# Patient Record
Sex: Female | Born: 1945 | Race: Black or African American | Hispanic: No | Marital: Married | Smoking: Former smoker
Health system: Southern US, Community
[De-identification: ages and names within clinical notes are randomized; demographics above are authoritative.]

## PROBLEM LIST (undated history)

## (undated) DIAGNOSIS — E785 Hyperlipidemia, unspecified: Secondary | ICD-10-CM

## (undated) DIAGNOSIS — E049 Nontoxic goiter, unspecified: Secondary | ICD-10-CM

## (undated) DIAGNOSIS — Z862 Personal history of diseases of the blood and blood-forming organs and certain disorders involving the immune mechanism: Secondary | ICD-10-CM

## (undated) DIAGNOSIS — M1990 Unspecified osteoarthritis, unspecified site: Secondary | ICD-10-CM

## (undated) DIAGNOSIS — J302 Other seasonal allergic rhinitis: Secondary | ICD-10-CM

## (undated) DIAGNOSIS — T7840XA Allergy, unspecified, initial encounter: Secondary | ICD-10-CM

## (undated) DIAGNOSIS — IMO0002 Reserved for concepts with insufficient information to code with codable children: Secondary | ICD-10-CM

## (undated) DIAGNOSIS — Z5189 Encounter for other specified aftercare: Secondary | ICD-10-CM

## (undated) DIAGNOSIS — D649 Anemia, unspecified: Secondary | ICD-10-CM

## (undated) HISTORY — DX: Allergy, unspecified, initial encounter: T78.40XA

## (undated) HISTORY — DX: Personal history of diseases of the blood and blood-forming organs and certain disorders involving the immune mechanism: Z86.2

## (undated) HISTORY — PX: DILATION AND CURETTAGE OF UTERUS: SHX78

## (undated) HISTORY — DX: Hyperlipidemia, unspecified: E78.5

## (undated) HISTORY — DX: Encounter for other specified aftercare: Z51.89

## (undated) HISTORY — DX: Unspecified osteoarthritis, unspecified site: M19.90

## (undated) HISTORY — PX: OVARIAN CYST REMOVAL: SHX89

## (undated) HISTORY — DX: Nontoxic goiter, unspecified: E04.9

## (undated) HISTORY — DX: Anemia, unspecified: D64.9

## (undated) HISTORY — DX: Other seasonal allergic rhinitis: J30.2

## (undated) HISTORY — DX: Reserved for concepts with insufficient information to code with codable children: IMO0002

---

## 1975-03-12 HISTORY — PX: APPENDECTOMY: SHX54

## 1975-03-12 HISTORY — PX: MYOMECTOMY: SHX85

## 1980-03-11 HISTORY — PX: LAPAROSCOPY: SHX197

## 2004-03-11 HISTORY — PX: BIOPSY THYROID: PRO38

## 2005-03-11 DIAGNOSIS — E049 Nontoxic goiter, unspecified: Secondary | ICD-10-CM

## 2005-03-11 HISTORY — DX: Nontoxic goiter, unspecified: E04.9

## 2011-08-27 DIAGNOSIS — H2510 Age-related nuclear cataract, unspecified eye: Secondary | ICD-10-CM | POA: Diagnosis not present

## 2012-09-04 DIAGNOSIS — H43399 Other vitreous opacities, unspecified eye: Secondary | ICD-10-CM | POA: Diagnosis not present

## 2012-09-04 DIAGNOSIS — H2510 Age-related nuclear cataract, unspecified eye: Secondary | ICD-10-CM | POA: Diagnosis not present

## 2012-10-08 ENCOUNTER — Ambulatory Visit (INDEPENDENT_AMBULATORY_CARE_PROVIDER_SITE_OTHER): Payer: Medicare Other | Admitting: Family Medicine

## 2012-10-08 ENCOUNTER — Encounter: Payer: Self-pay | Admitting: Family Medicine

## 2012-10-08 VITALS — BP 150/90 | HR 84 | Temp 97.8°F | Ht 61.5 in | Wt 167.5 lb

## 2012-10-08 DIAGNOSIS — E785 Hyperlipidemia, unspecified: Secondary | ICD-10-CM | POA: Insufficient documentation

## 2012-10-08 DIAGNOSIS — Z862 Personal history of diseases of the blood and blood-forming organs and certain disorders involving the immune mechanism: Secondary | ICD-10-CM | POA: Insufficient documentation

## 2012-10-08 DIAGNOSIS — E049 Nontoxic goiter, unspecified: Secondary | ICD-10-CM

## 2012-10-08 DIAGNOSIS — J309 Allergic rhinitis, unspecified: Secondary | ICD-10-CM

## 2012-10-08 DIAGNOSIS — J302 Other seasonal allergic rhinitis: Secondary | ICD-10-CM

## 2012-10-08 DIAGNOSIS — R030 Elevated blood-pressure reading, without diagnosis of hypertension: Secondary | ICD-10-CM

## 2012-10-08 MED ORDER — ATORVASTATIN CALCIUM 20 MG PO TABS: 20.0000 mg | ORAL_TABLET | Freq: Every day | ORAL | Status: DC

## 2012-10-08 NOTE — Progress Notes (Signed)
Subjective:    Patient ID: Valerie Paul, female    DOB: 02-18-1946, 67 y.o.   MRN: [id number]  HPI CC: new pt to establish  Medicare eligible 03/12/2011. Pleasant 67 yo presents today to establish care.  Prior saw Dr. Josph Macho and Dr. Charlann Boxer in [place], [place].  Moved here 3 years ago.  Elevated BP - today elevated.  Usually runs a bit low - attributes to new doctor.  HLD - ran out of lipitor 2 yrs ago.  Prior tolerated well.  Unsure how high chol was prior to starting statin.  H/o OA - stable currently.  Brings hard copy of xrays done in past.  Not fasting today.  Lives with husband, no pets Occupation: retired, was Wellsite geologist Edu: BS business administration Activity: exercise class 3x/wk, walking  Diet: good water, some fruits/vegetables  Preventative: Last CPE 4 yrs ago. Pap 1997 mammo 1997 G0P0 No tetanus shot recently, no pneumovax. Flu shot - did receive in the past  Medications and allergies reviewed and updated in chart.  Past histories reviewed and updated if relevant as below. There are no active problems to display for this patient.  Past Medical History  Diagnosis Date  . History of anemia   . Arthritis   . Seasonal allergies     pollen  . HLD (hyperlipidemia)   . Goiter 2007    s/p benign biopsy, ([facility])  . Infertility 1970s    tube blockage   Past Surgical History  Procedure Laterality Date  . Dilation and curettage of uterus    . Laparoscopy  1982  . Myomectomy  1977    fibroids  . Ovarian cyst removal      right  . Appendectomy  1977  . Biopsy thyroid  2006    normal per pt Josph Macho)   History  Substance Use Topics  . Smoking status: Former Smoker -- 1.00 packs/day    Start date: 03/12/1963    Quit date: 03/12/2003  . Smokeless tobacco: Never Used  . Alcohol Use: Yes     Comment: Rare   Family History  Problem Relation Age of Onset  . Cancer Brother 48    lung (smoker)  . Cancer Mother 30    brain  . Heart failure Father   .  CAD Neg Hx   . Stroke Neg Hx   . Diabetes Neg Hx   . Hypertension Maternal Aunt    No Known Allergies No current outpatient prescriptions on file prior to visit.   No current facility-administered medications on file prior to visit.     Review of Systems  Constitutional: Negative for fever, chills, activity change, appetite change, fatigue and unexpected weight change.  HENT: Negative for hearing loss and neck pain.   Eyes: Negative for visual disturbance.  Respiratory: Negative for cough, chest tightness, shortness of breath and wheezing.   Cardiovascular: Negative for chest pain, palpitations and leg swelling.  Gastrointestinal: Negative for nausea, vomiting, abdominal pain, diarrhea, constipation, blood in stool and abdominal distention.  Genitourinary: Negative for hematuria and difficulty urinating.  Musculoskeletal: Negative for myalgias and arthralgias.  Skin: Negative for rash.  Neurological: Negative for dizziness, seizures, syncope and headaches.  Hematological: Negative for adenopathy. Does not bruise/bleed easily.  Psychiatric/Behavioral: Negative for dysphoric mood. The patient is not nervous/anxious.        Objective:   Physical Exam  Nursing note and vitals reviewed. Constitutional: She is oriented to person, place, and time. She appears well-developed and well-nourished. No distress.  HENT:  Head: Normocephalic and atraumatic.  Mouth/Throat: Oropharynx is clear and moist. No oropharyngeal exudate.  Eyes: Conjunctivae and EOM are normal. Pupils are equal, round, and reactive to light. No scleral icterus.  Neck: Normal range of motion. Neck supple. No thyromegaly present.  Cardiovascular: Normal rate, regular rhythm, normal heart sounds and intact distal pulses.   No murmur heard. Pulses:      Radial pulses are 2+ on the right side, and 2+ on the left side.  Pulmonary/Chest: Effort normal and breath sounds normal. No respiratory distress. She has no wheezes. She  has no rales.  Abdominal: Soft. Bowel sounds are normal. She exhibits no distension and no mass. There is no tenderness. There is no rebound and no guarding.  Musculoskeletal: Normal range of motion. She exhibits no edema.  Lymphadenopathy:    She has no cervical adenopathy.  Neurological: She is alert and oriented to person, place, and time.  CN grossly intact, station and gait intact  Skin: Skin is warm and dry. No rash noted.  Psychiatric: She has a normal mood and affect. Her behavior is normal. Judgment and thought content normal.       Assessment & Plan:  Provided with # for breast center for pt to call and schedule screening mammogram.

## 2012-10-08 NOTE — Assessment & Plan Note (Signed)
Check FLP then start prior lipitor.

## 2012-10-08 NOTE — Assessment & Plan Note (Signed)
Check TSH next blood draw. Will request records at next appointment.

## 2012-10-08 NOTE — Assessment & Plan Note (Signed)
Discussed this - discussed DASH and provided with handout, recommended mediterranean diet. Pt will monitor at local pharmacy and notify me if consistently elevated.

## 2012-10-08 NOTE — Patient Instructions (Signed)
Return tomorrow fasting for blood work. Let's keep an eye on blood pressure - at local pharmacy.  Let me know if consistently >140/90. Look into mediterranean diet. Return in next few months at your convenience for medicare wellness visit. Good to see you today, call us with questions.   DASH Diet The DASH diet stands for "Dietary Approaches to Stop Hypertension." It is a healthy eating plan that has been shown to reduce high blood pressure (hypertension) in as [place] as 14 days, while also possibly providing other significant health benefits. These other health benefits include reducing the risk of breast cancer after menopause and reducing the risk of type 2 diabetes, heart disease, colon cancer, and stroke. Health benefits also include weight loss and slowing kidney failure in patients with chronic kidney disease.  DIET GUIDELINES  Limit salt (sodium). Your diet should contain less than 1500 mg of sodium daily.  Limit refined or processed carbohydrates. Your diet should include mostly whole grains. Desserts and added sugars should be used sparingly.  Include small amounts of heart-healthy fats. These types of fats include nuts, oils, and tub margarine. Limit saturated and trans fats. These fats have been shown to be harmful in the body. CHOOSING FOODS  The following food groups are based on a 2000 calorie diet. See your Registered Dietitian for individual calorie needs. Grains and Grain Products (6 to 8 servings daily)  Eat More Often: Whole-wheat bread, brown rice, whole-grain or wheat pasta, quinoa, popcorn without added fat or salt (air popped).  Eat Less Often: White bread, white pasta, white rice, cornbread. Vegetables (4 to 5 servings daily)  Eat More Often: Fresh, frozen, and canned vegetables. Vegetables may be raw, steamed, roasted, or grilled with a minimal amount of fat.  Eat Less Often/Avoid: Creamed or fried vegetables. Vegetables in a cheese sauce. Fruit (4 to 5 servings  daily)  Eat More Often: All fresh, canned (in natural juice), or frozen fruits. Dried fruits without added sugar. One hundred percent fruit juice ( cup [237 mL] daily).  Eat Less Often: Dried fruits with added sugar. Canned fruit in light or heavy syrup. [employer], Fish, and Poultry (2 servings or less daily. One serving is 3 to 4 oz [85-114 g]).  Eat More Often: Ninety percent or leaner ground beef, tenderloin, sirloin. Round cuts of beef, chicken breast, [place] breast. All fish. Grill, bake, or broil your meat. Nothing should be fried.  Eat Less Often/Avoid: Fatty cuts of meat, [place], or chicken leg, thigh, or wing. Fried cuts of meat or fish. Dairy (2 to 3 servings)  Eat More Often: Low-fat or fat-free milk, low-fat plain or light yogurt, reduced-fat or part-skim cheese.  Eat Less Often/Avoid: Milk (whole, 2%).Whole milk yogurt. Full-fat cheeses. Nuts, Seeds, and Legumes (4 to 5 servings per week)  Eat More Often: All without added salt.  Eat Less Often/Avoid: Salted nuts and seeds, canned beans with added salt. Fats and Sweets (limited)  Eat More Often: Vegetable oils, tub margarines without trans fats, sugar-free gelatin. Mayonnaise and salad dressings.  Eat Less Often/Avoid: Coconut oils, palm oils, butter, stick margarine, cream, half and half, cookies, candy, pie. FOR MORE INFORMATION The Dash Diet Eating Plan: www.dashdiet.org Document Released: 02/14/2011 Document Revised: 05/20/2011 Document Reviewed: 02/14/2011 [employer] Patient Information 2014 [place], [place].

## 2012-10-09 ENCOUNTER — Other Ambulatory Visit (INDEPENDENT_AMBULATORY_CARE_PROVIDER_SITE_OTHER): Payer: Medicare Other

## 2012-10-09 DIAGNOSIS — E049 Nontoxic goiter, unspecified: Secondary | ICD-10-CM

## 2012-10-09 DIAGNOSIS — R030 Elevated blood-pressure reading, without diagnosis of hypertension: Secondary | ICD-10-CM | POA: Diagnosis not present

## 2012-10-09 DIAGNOSIS — E785 Hyperlipidemia, unspecified: Secondary | ICD-10-CM | POA: Diagnosis not present

## 2012-10-09 DIAGNOSIS — Z862 Personal history of diseases of the blood and blood-forming organs and certain disorders involving the immune mechanism: Secondary | ICD-10-CM | POA: Diagnosis not present

## 2012-10-09 LAB — CBC WITH DIFFERENTIAL/PLATELET
Basophils Absolute: 0.1 10*3/uL (ref 0.0–0.1)
Basophils Relative: 1.4 % (ref 0.0–3.0)
Eosinophils Absolute: 0.2 10*3/uL (ref 0.0–0.7)
Hemoglobin: 13.3 g/dL (ref 12.0–15.0)
Lymphocytes Relative: 46.8 % — ABNORMAL HIGH (ref 12.0–46.0)
MCHC: 33.7 g/dL (ref 30.0–36.0)
MCV: 88.4 fl (ref 78.0–100.0)
Monocytes Absolute: 0.4 10*3/uL (ref 0.1–1.0)
Neutro Abs: 1.9 10*3/uL (ref 1.4–7.7)
RDW: 13.1 % (ref 11.5–14.6)

## 2012-10-09 LAB — BASIC METABOLIC PANEL
CO2: 28 mEq/L (ref 19–32)
Calcium: 9.3 mg/dL (ref 8.4–10.5)
Chloride: 106 mEq/L (ref 96–112)
Creatinine, Ser: 1 mg/dL (ref 0.4–1.2)
Glucose, Bld: 92 mg/dL (ref 70–99)

## 2012-10-09 LAB — LIPID PANEL
HDL: 37.2 mg/dL — ABNORMAL LOW (ref >39.00)
Triglycerides: 214 mg/dL — ABNORMAL HIGH (ref 0.0–149.0)

## 2012-10-14 ENCOUNTER — Other Ambulatory Visit: Payer: Self-pay

## 2013-01-08 ENCOUNTER — Other Ambulatory Visit (HOSPITAL_COMMUNITY)
Admission: RE | Admit: 2013-01-08 | Discharge: 2013-01-08 | Disposition: A | Discharge status: Home / Self Care | Payer: Medicare Other | Source: Ambulatory Visit | Attending: Family Medicine | Admitting: Family Medicine

## 2013-01-08 ENCOUNTER — Other Ambulatory Visit: Payer: Self-pay | Admitting: *Deleted

## 2013-01-08 ENCOUNTER — Encounter: Payer: Self-pay | Admitting: Family Medicine

## 2013-01-08 ENCOUNTER — Ambulatory Visit (INDEPENDENT_AMBULATORY_CARE_PROVIDER_SITE_OTHER): Payer: Medicare Other | Admitting: Family Medicine

## 2013-01-08 VITALS — BP 146/88 | HR 84 | Temp 98.5°F | Ht 61.5 in | Wt 168.5 lb

## 2013-01-08 DIAGNOSIS — E785 Hyperlipidemia, unspecified: Secondary | ICD-10-CM

## 2013-01-08 DIAGNOSIS — I700 Atherosclerosis of aorta: Secondary | ICD-10-CM | POA: Insufficient documentation

## 2013-01-08 DIAGNOSIS — Z0000 Encounter for general adult medical examination without abnormal findings: Secondary | ICD-10-CM | POA: Diagnosis not present

## 2013-01-08 DIAGNOSIS — Z1211 Encounter for screening for malignant neoplasm of colon: Secondary | ICD-10-CM

## 2013-01-08 DIAGNOSIS — Z1231 Encounter for screening mammogram for malignant neoplasm of breast: Secondary | ICD-10-CM | POA: Diagnosis not present

## 2013-01-08 DIAGNOSIS — R0989 Other specified symptoms and signs involving the circulatory and respiratory systems: Secondary | ICD-10-CM

## 2013-01-08 DIAGNOSIS — Z01419 Encounter for gynecological examination (general) (routine) without abnormal findings: Secondary | ICD-10-CM | POA: Diagnosis not present

## 2013-01-08 DIAGNOSIS — Z1151 Encounter for screening for human papillomavirus (HPV): Secondary | ICD-10-CM | POA: Insufficient documentation

## 2013-01-08 DIAGNOSIS — Z23 Encounter for immunization: Secondary | ICD-10-CM

## 2013-01-08 DIAGNOSIS — R030 Elevated blood-pressure reading, without diagnosis of hypertension: Secondary | ICD-10-CM

## 2013-01-08 LAB — HEPATIC FUNCTION PANEL
ALT: 26 U/L (ref 0–35)
AST: 25 U/L (ref 0–37)
Albumin: 4.3 g/dL (ref 3.5–5.2)
Alkaline Phosphatase: 74 U/L (ref 39–117)
Bilirubin, Direct: 0 mg/dL (ref 0.0–0.3)
Total Protein: 8 g/dL (ref 6.0–8.3)

## 2013-01-08 LAB — LIPID PANEL
Cholesterol: 158 mg/dL (ref 0–200)
LDL Cholesterol: 91 mg/dL (ref 0–99)
Total CHOL/HDL Ratio: 3
VLDL: 17.8 mg/dL (ref 0.0–40.0)

## 2013-01-08 MED ORDER — ATORVASTATIN CALCIUM 20 MG PO TABS: 20.0000 mg | ORAL_TABLET | Freq: Every day | ORAL | Status: DC

## 2013-01-08 NOTE — Patient Instructions (Addendum)
Flu and pneumonia shot today. Call your insurance about the shingles shot to see if it is covered or how much it would cost and where is cheaper (here or pharmacy).  If you want to receive here, call for nurse visit. We will call you to schedule mammogram. Pelvic/pap smear today. Advanced directive packet provided today. stool kit today We will schedule abdominal ultrasound. Blood work today. Return as needed or in 6 months for follow up

## 2013-01-08 NOTE — Progress Notes (Signed)
Subjective:    Patient ID: Valerie Paul, female    DOB: 1945-05-08, 67 y.o.   MRN: [id number]  HPI CC: medicare wellness visit, initial  BP Readings from Last 3 Encounters:  01/08/13 146/88  10/08/12 150/90    Hearing and vision screens passed. No falls. No depression/anhedonia,sadness.  Lives with husband, no pets  Occupation: retired, was Wellsite geologist  Edu: BS business administration  Activity: exercise class 3x/wk, walking  Diet: good water, some fruits/vegetables   2/5 concentration, 5/5 with starting at 20.  DLROW.  Preventative:  Pap last 1997.  Will be due today Mammogram - has not done since 1997.  Ordered today. Colon cancer - discussed iFOB. G0P0  Flu shot - today Pneumovax - today. zostavax - will check on this. Advanced directives - packet provided today.  Medications and allergies reviewed and updated in chart.  Past histories reviewed and updated if relevant as below. Patient Active Problem List   Diagnosis Date Noted  . Elevated blood pressure reading 10/08/2012  . History of anemia   . Seasonal allergies   . HLD (hyperlipidemia)   . Goiter    Past Medical History  Diagnosis Date  . History of anemia   . Arthritis   . Seasonal allergies     pollen  . HLD (hyperlipidemia)   . Goiter 2007    s/p benign biopsy, ([facility])  . Infertility 1970s    tube blockage, G0P0   Past Surgical History  Procedure Laterality Date  . Dilation and curettage of uterus    . Laparoscopy  1982  . Myomectomy  1977    fibroids  . Ovarian cyst removal      right  . Appendectomy  1977  . Biopsy thyroid  2006    normal per pt Josph Macho)   History  Substance Use Topics  . Smoking status: Former Smoker -- 1.00 packs/day    Start date: 03/12/1963    Quit date: 03/12/2003  . Smokeless tobacco: Never Used  . Alcohol Use: Yes     Comment: Rare   Family History  Problem Relation Age of Onset  . Cancer Brother 48    lung (smoker)  . Cancer Mother 30   brain  . Heart failure Father   . CAD Neg Hx   . Stroke Neg Hx   . Diabetes Neg Hx   . Hypertension Maternal Aunt    No Known Allergies Current Outpatient Prescriptions on File Prior to Visit  Medication Sig Dispense Refill  . atorvastatin (LIPITOR) 20 MG tablet Take 1 tablet (20 mg total) by mouth daily.  90 tablet  3  . cholecalciferol (VITAMIN D) 1000 UNITS tablet Take 1,000 Units by mouth daily.      Marland Kitchen GARLIC PO Take by mouth daily.      . Multiple Vitamin (MULTIVITAMIN) tablet Take 1 tablet by mouth daily.      . vitamin C (ASCORBIC ACID) 500 MG tablet Take 500 mg by mouth daily.       No current facility-administered medications on file prior to visit.     Review of Systems  Constitutional: Negative for fever, chills, activity change, appetite change, fatigue and unexpected weight change.  HENT: Negative for hearing loss.   Eyes: Negative for visual disturbance.  Respiratory: Negative for cough, chest tightness, shortness of breath and wheezing.   Cardiovascular: Negative for chest pain, palpitations and leg swelling.  Gastrointestinal: Negative for nausea, vomiting, abdominal pain, diarrhea, constipation, blood in stool and abdominal  distention.  Genitourinary: Negative for hematuria and difficulty urinating.  Musculoskeletal: Negative for arthralgias, myalgias and neck pain.  Skin: Negative for rash.  Neurological: Negative for dizziness, seizures, syncope and headaches.  Hematological: Negative for adenopathy. Does not bruise/bleed easily.  Psychiatric/Behavioral: Negative for dysphoric mood. The patient is not nervous/anxious.        Objective:   Physical Exam  Nursing note and vitals reviewed. Constitutional: She is oriented to person, place, and time. She appears well-developed and well-nourished. No distress.  HENT:  Head: Normocephalic and atraumatic.  Right Ear: Hearing, tympanic membrane, external ear and ear canal normal.  Left Ear: Hearing, tympanic  membrane, external ear and ear canal normal.  Nose: Nose normal.  Mouth/Throat: Oropharynx is clear and moist. No oropharyngeal exudate.  Eyes: Conjunctivae and EOM are normal. Pupils are equal, round, and reactive to light. No scleral icterus.  Neck: Normal range of motion. Neck supple. Carotid bruit is not present. No thyromegaly present.  Cardiovascular: Normal rate, regular rhythm, normal heart sounds and intact distal pulses.   No murmur heard. Pulses:      Radial pulses are 2+ on the right side, and 2+ on the left side.  Pulmonary/Chest: Effort normal and breath sounds normal. No respiratory distress. She has no wheezes. She has no rales. Right breast exhibits no inverted nipple, no mass, no nipple discharge, no skin change and no tenderness. Left breast exhibits no inverted nipple, no mass, no nipple discharge, no skin change and no tenderness. Breasts are symmetrical.  Abdominal: Soft. Bowel sounds are normal. She exhibits no distension and no mass. There is no tenderness. There is no rebound and no guarding. Hernia confirmed negative in the right inguinal area and confirmed negative in the left inguinal area.  abd bruit present, as well as some to left  Genitourinary: Vagina normal and uterus normal. Pelvic exam was performed with patient supine. There is no rash, tenderness, lesion or injury on the right labia. There is no rash, tenderness, lesion or injury on the left labia. Cervix exhibits no motion tenderness. Right adnexum displays no mass, no tenderness and no fullness. Left adnexum displays no mass, no tenderness and no fullness.  Pap performed on cervix  Musculoskeletal: Normal range of motion. She exhibits no edema.  Lymphadenopathy:       Head (right side): No submental, no submandibular and no tonsillar adenopathy present.       Head (left side): No submental, no submandibular and no tonsillar adenopathy present.    She has no cervical adenopathy.    She has no axillary  adenopathy.       Right axillary: No lateral adenopathy present.       Left axillary: No lateral adenopathy present.      Right: No supraclavicular adenopathy present.       Left: No supraclavicular adenopathy present.  Neurological: She is alert and oriented to person, place, and time.  CN grossly intact, station and gait intact  Skin: Skin is warm and dry. No rash noted.  Psychiatric: She has a normal mood and affect. Her behavior is normal. Judgment and thought content normal.       Assessment & Plan:

## 2013-01-08 NOTE — Assessment & Plan Note (Addendum)
I have personally reviewed the Medicare Annual Wellness questionnaire and have noted 1. The patient's medical and social history 2. Their use of alcohol, tobacco or illicit drugs 3. Their current medications and supplements 4. The patient's functional ability including ADL's, fall risks, home safety risks and hearing or visual impairment. 5. Diet and physical activity 6. Evidence for depression or mood disorders The patients weight, height, BMI have been recorded in the chart.  Hearing and vision has been addressed. I have made referrals, counseling and provided education to the patient based review of the above and I have provided the pt with a written personalized care plan for preventive services. See scanned questionairre. Advanced directives discussed: packet provided today  Reviewed preventative protocols and updated unless pt declined.  Flu and pneumovax today. Pelvic reassuring today. Sent off pap Breast exam reassuring today.  Will schedule mammogram.

## 2013-01-08 NOTE — Assessment & Plan Note (Signed)
Check FLP, LFTs today. Continue lipitor 20mg  dialy.

## 2013-01-08 NOTE — Assessment & Plan Note (Signed)
Continue to monitor

## 2013-01-08 NOTE — Addendum Note (Signed)
Addended by: Josph Macho A on: 01/08/2013 12:15 PM   Modules accepted: Orders

## 2013-01-08 NOTE — Addendum Note (Signed)
Addended by: Josph Macho A on: 01/08/2013 12:29 PM   Modules accepted: Orders

## 2013-01-08 NOTE — Assessment & Plan Note (Signed)
In 45 PY smoker. Check abd duplex.

## 2013-01-12 ENCOUNTER — Other Ambulatory Visit: Payer: Medicare Other

## 2013-01-12 DIAGNOSIS — Z1211 Encounter for screening for malignant neoplasm of colon: Secondary | ICD-10-CM

## 2013-01-13 ENCOUNTER — Encounter: Payer: Self-pay | Admitting: *Deleted

## 2013-01-29 ENCOUNTER — Encounter (INDEPENDENT_AMBULATORY_CARE_PROVIDER_SITE_OTHER): Payer: Medicare Other

## 2013-01-29 DIAGNOSIS — R0989 Other specified symptoms and signs involving the circulatory and respiratory systems: Secondary | ICD-10-CM | POA: Diagnosis not present

## 2013-01-29 DIAGNOSIS — I700 Atherosclerosis of aorta: Secondary | ICD-10-CM | POA: Diagnosis not present

## 2013-02-10 ENCOUNTER — Ambulatory Visit
Admission: RE | Admit: 2013-02-10 | Discharge: 2013-02-10 | Disposition: A | Discharge status: Home / Self Care | Payer: Medicare Other | Source: Ambulatory Visit | Attending: Family Medicine | Admitting: Family Medicine

## 2013-02-10 DIAGNOSIS — Z1231 Encounter for screening mammogram for malignant neoplasm of breast: Secondary | ICD-10-CM | POA: Diagnosis not present

## 2013-09-21 DIAGNOSIS — H2510 Age-related nuclear cataract, unspecified eye: Secondary | ICD-10-CM | POA: Diagnosis not present

## 2013-12-21 DIAGNOSIS — Z23 Encounter for immunization: Secondary | ICD-10-CM | POA: Diagnosis not present

## 2014-01-12 ENCOUNTER — Telehealth: Payer: Self-pay | Admitting: Family Medicine

## 2014-01-12 ENCOUNTER — Encounter: Payer: Self-pay | Admitting: Family Medicine

## 2014-01-12 ENCOUNTER — Ambulatory Visit (INDEPENDENT_AMBULATORY_CARE_PROVIDER_SITE_OTHER): Payer: Medicare Other | Admitting: Family Medicine

## 2014-01-12 ENCOUNTER — Other Ambulatory Visit: Payer: Self-pay | Admitting: *Deleted

## 2014-01-12 VITALS — BP 126/72 | HR 88 | Temp 97.0°F | Ht 61.5 in | Wt 165.5 lb

## 2014-01-12 DIAGNOSIS — E785 Hyperlipidemia, unspecified: Secondary | ICD-10-CM

## 2014-01-12 DIAGNOSIS — Z23 Encounter for immunization: Secondary | ICD-10-CM

## 2014-01-12 DIAGNOSIS — Z1211 Encounter for screening for malignant neoplasm of colon: Secondary | ICD-10-CM

## 2014-01-12 DIAGNOSIS — Z0000 Encounter for general adult medical examination without abnormal findings: Secondary | ICD-10-CM | POA: Diagnosis not present

## 2014-01-12 DIAGNOSIS — E049 Nontoxic goiter, unspecified: Secondary | ICD-10-CM | POA: Diagnosis not present

## 2014-01-12 DIAGNOSIS — I700 Atherosclerosis of aorta: Secondary | ICD-10-CM

## 2014-01-12 DIAGNOSIS — Z7189 Other specified counseling: Secondary | ICD-10-CM | POA: Insufficient documentation

## 2014-01-12 LAB — COMPREHENSIVE METABOLIC PANEL
ALT: 35 U/L (ref 0–35)
AST: 34 U/L (ref 0–37)
Albumin: 3.7 g/dL (ref 3.5–5.2)
Alkaline Phosphatase: 80 U/L (ref 39–117)
BILIRUBIN TOTAL: 0.5 mg/dL (ref 0.2–1.2)
BUN: 11 mg/dL (ref 6–23)
CO2: 25 mEq/L (ref 19–32)
Calcium: 9.4 mg/dL (ref 8.4–10.5)
Chloride: 106 mEq/L (ref 96–112)
Creatinine, Ser: 1 mg/dL (ref 0.4–1.2)
GFR: 72.56 mL/min (ref >60.00)
GLUCOSE: 85 mg/dL (ref 70–99)
Potassium: 4.1 mEq/L (ref 3.5–5.1)
SODIUM: 140 meq/L (ref 135–145)
Total Protein: 7.6 g/dL (ref 6.0–8.3)

## 2014-01-12 LAB — LIPID PANEL
CHOLESTEROL: 142 mg/dL (ref 0–200)
HDL: 45.2 mg/dL (ref >39.00)
LDL Cholesterol: 82 mg/dL (ref 0–99)
NonHDL: 96.8
Total CHOL/HDL Ratio: 3
Triglycerides: 72 mg/dL (ref 0.0–149.0)
VLDL: 14.4 mg/dL (ref 0.0–40.0)

## 2014-01-12 LAB — TSH: TSH: 0.61 u[IU]/mL (ref 0.35–4.50)

## 2014-01-12 MED ORDER — ATORVASTATIN CALCIUM 20 MG PO TABS: 20.0000 mg | ORAL_TABLET | Freq: Every day | ORAL | Status: DC

## 2014-01-12 NOTE — Addendum Note (Signed)
Addended by: Royann Shivers A on: 01/12/2014 11:28 AM   Modules accepted: Orders

## 2014-01-12 NOTE — Assessment & Plan Note (Signed)

## 2014-01-12 NOTE — Assessment & Plan Note (Signed)
Mild by [place] 2014. Continue to control modifiable risk factors (HTN, HLD). Discussed with patient.

## 2014-01-12 NOTE — Progress Notes (Signed)
BP 126/72 mmHg  Pulse 88  Temp(Src) 97 F (36.1 C) (Oral)  Ht 5' 1.5" (1.562 m)  Wt 165 lb 8 oz (75.07 kg)  BMI 30.77 kg/m2   CC: medicare wellness visit  Subjective:    Patient ID: Valerie Paul, female    DOB: 1945/12/13, 68 y.o.   MRN: [id number]  HPI: Valerie Paul is a 68 y.o. female presenting on 01/12/2014 for Annual Exam   Working on 10,000 steps/day. Yesterday worked at [employer] all day (14 hours) Wt Readings from Last 3 Encounters:  01/12/14 165 lb 8 oz (75.07 kg)  01/08/13 168 lb 8 oz (76.431 kg)  10/08/12 167 lb 8 oz (75.978 kg)   Body mass index is 30.77 kg/(m^2).   Hearing screen passed. Vision screen 09/2013 with eye doctor. No falls. No depression/anhedonia,sadness.  2/5 concentration, 5/5 with starting at 20. DLROW.  Preventative: Pap last 1997. normal 2014. Would like to readdress in 2017 Mammogram - [facility] 1. 02/2013. Colon cancer - discussed, requests iFOB. Dexa - done, per pt normal but unsure where. Flu shot - done Pneumovax - today. zostavax - expensive. May get at pharmacy. Will let us know.  Advanced directives - packet provided today. Would want husband to be HCPOA.  G0P0   Lives with husband, no pets  Occupation: retired, was Engineer, mining  Edu: BS business administration  Activity: exercise class 3x/wk, walking  Diet: good water, some fruits/vegetables   Relevant past medical, surgical, family and social history reviewed and updated as indicated.  Allergies and medications reviewed and updated. Current Outpatient Prescriptions on File Prior to Visit  Medication Sig  . aspirin 81 MG tablet Take 81 mg by mouth daily.  Marland Kitchen atorvastatin (LIPITOR) 20 MG tablet Take 1 tablet (20 mg total) by mouth daily.  . cholecalciferol (VITAMIN D) 1000 UNITS tablet Take 1,000 Units by mouth daily.  Marland Kitchen GARLIC PO Take by mouth daily.  . Multiple Vitamin (MULTIVITAMIN) tablet Take 1 tablet by mouth daily.  . vitamin C (ASCORBIC ACID) 500 MG tablet  Take 500 mg by mouth daily.   No current facility-administered medications on file prior to visit.   Past Medical History  Diagnosis Date  . History of anemia   . Arthritis   . Seasonal allergies     pollen  . HLD (hyperlipidemia)   . Goiter 2007    s/p benign biopsy, ([facility])  . Infertility 1970s    tube blockage, G0P0    Past Surgical History  Procedure Laterality Date  . Dilation and curettage of uterus    . Laparoscopy  1982  . Myomectomy  1977    fibroids  . Ovarian cyst removal      right  . Appendectomy  1977  . Biopsy thyroid  2006    normal per pt ([facility])   Family History  Problem Relation Age of Onset  . Cancer Brother 29    lung (smoker)  . Cancer Mother 63    brain  . Heart failure Father   . CAD Neg Hx   . Stroke Neg Hx   . Diabetes Neg Hx   . Hypertension Maternal Aunt    Review of Systems Per HPI unless specifically indicated above    Objective:    BP 126/72 mmHg  Pulse 88  Temp(Src) 97 F (36.1 C) (Oral)  Ht 5' 1.5" (1.562 m)  Wt 165 lb 8 oz (75.07 kg)  BMI 30.77 kg/m2  Physical Exam  Constitutional: She is  oriented to person, place, and time. She appears well-developed and well-nourished. No distress.  HENT:  Head: Normocephalic and atraumatic.  Right Ear: Hearing, tympanic membrane, external ear and ear canal normal.  Left Ear: Hearing, tympanic membrane, external ear and ear canal normal.  Nose: Nose normal.  Mouth/Throat: Uvula is midline, oropharynx is clear and moist and mucous membranes are normal. No oropharyngeal exudate, posterior oropharyngeal edema or posterior oropharyngeal erythema.  Eyes: Conjunctivae and EOM are normal. Pupils are equal, round, and reactive to light. No scleral icterus.  Neck: Normal range of motion. Neck supple. Carotid bruit is not present. No thyromegaly present.  Cardiovascular: Normal rate, regular rhythm, normal heart sounds and intact distal pulses.   No murmur heard. Pulses:      Radial pulses  are 2+ on the right side, and 2+ on the left side.  Pulmonary/Chest: Effort normal and breath sounds normal. No respiratory distress. She has no wheezes. She has no rales. Right breast exhibits no inverted nipple, no mass, no nipple discharge, no skin change and no tenderness. Left breast exhibits no inverted nipple, no mass, no nipple discharge, no skin change and no tenderness.  Abdominal: Soft. Bowel sounds are normal. She exhibits no distension and no mass. There is no tenderness. There is no rebound and no guarding.  Musculoskeletal: Normal range of motion. She exhibits no edema.  Lymphadenopathy:       Head (right side): No submental, no submandibular, no tonsillar, no preauricular and no posterior auricular adenopathy present.       Head (left side): No submental, no submandibular, no tonsillar, no preauricular and no posterior auricular adenopathy present.    She has no cervical adenopathy.    She has no axillary adenopathy.       Right axillary: No lateral adenopathy present.       Left axillary: No lateral adenopathy present.      Right: No supraclavicular adenopathy present.       Left: No supraclavicular adenopathy present.  Neurological: She is alert and oriented to person, place, and time.  CN grossly intact, station and gait intact Recall 3/3  Calculation 5/5 serial 3s  Skin: Skin is warm and dry. No rash noted.  Psychiatric: She has a normal mood and affect. Her behavior is normal. Judgment and thought content normal.  Nursing note and vitals reviewed.  Results for orders placed or performed in visit on 01/13/13  Fecal Occult Blood, Guaiac  Result Value Ref Range   Fecal Occult Blood Negative       Assessment & Plan:   Problem List Items Addressed This Visit    Medicare annual wellness visit, subsequent - Primary    I have personally reviewed the Medicare Annual Wellness questionnaire and have noted 1. The patient's medical and social history 2. Their use of alcohol,  tobacco or illicit drugs 3. Their current medications and supplements 4. The patient's functional ability including ADL's, fall risks, home safety risks and hearing or visual impairment. 5. Diet and physical activity 6. Evidence for depression or mood disorders The patients weight, height, BMI have been recorded in the chart.  Hearing and vision has been addressed. I have made referrals, counseling and provided education to the patient based review of the above and I have provided the pt with a written personalized care plan for preventive services. Provider list updated - see scanned questionairre.  Reviewed preventative protocols and updated unless pt declined.    HLD (hyperlipidemia)    Chronic, stable.  Check FLP today. Continue lipitor 20mg  daily.    Relevant Orders      Comprehensive metabolic panel      TSH      Lipid panel   Goiter    Not appreciated on exam today. Check TSH.     Relevant Orders      TSH   Advanced care planning/counseling discussion    Advanced directives - packet provided today. Would want husband to be HCPOA.    Abdominal aortic atherosclerosis    Mild by [place] 2014. Continue to control modifiable risk factors (HTN, HLD). Discussed with patient.     Other Visit Diagnoses    Special screening for malignant neoplasms, colon        Relevant Orders       Fecal occult blood, imunochemical        Follow up plan: Return in about 1 year (around 01/13/2015), or as needed, for medicare wellness visit.

## 2014-01-12 NOTE — Assessment & Plan Note (Signed)
Not appreciated on exam today. Check TSH.

## 2014-01-12 NOTE — Assessment & Plan Note (Signed)
Advanced directives - packet provided today. Would want husband to be HCPOA.

## 2014-01-12 NOTE — Addendum Note (Signed)
Addended by: Daralene Milch C on: 01/12/2014 04:11 PM   Modules accepted: Miquel Dunn

## 2014-01-12 NOTE — Assessment & Plan Note (Signed)
Chronic, stable. Check FLP today. Continue lipitor 20mg  daily.

## 2014-01-12 NOTE — Addendum Note (Signed)
Addended by: Ria Bush on: 01/12/2014 11:18 AM   Modules accepted: Level of Service, SmartSet

## 2014-01-12 NOTE — Telephone Encounter (Signed)
When pt checked out from her [place] she wanted me to remind you to refill her Lipitor.  [facility]

## 2014-01-12 NOTE — Telephone Encounter (Signed)
done

## 2014-01-12 NOTE — Progress Notes (Signed)
Pre visit review using our clinic review tool, if applicable. No additional management support is needed unless otherwise documented below in the visit note. 

## 2014-01-12 NOTE — Patient Instructions (Addendum)
Prevnar today.  Stool kit today.  Blood work today. Advanced directive packet provided today.  Return as needed or in 1 year for next wellness visit. Call to schedule mammogram. Good to see you today, call us with questions.

## 2014-01-28 ENCOUNTER — Other Ambulatory Visit: Payer: Self-pay

## 2014-01-28 DIAGNOSIS — Z1231 Encounter for screening mammogram for malignant neoplasm of breast: Secondary | ICD-10-CM

## 2014-02-11 ENCOUNTER — Encounter: Payer: Self-pay | Admitting: *Deleted

## 2014-02-11 ENCOUNTER — Ambulatory Visit
Admission: RE | Admit: 2014-02-11 | Discharge: 2014-02-11 | Disposition: A | Discharge status: Home / Self Care | Payer: Medicare Other | Source: Ambulatory Visit

## 2014-02-11 DIAGNOSIS — Z1231 Encounter for screening mammogram for malignant neoplasm of breast: Secondary | ICD-10-CM

## 2014-02-11 LAB — HM MAMMOGRAPHY: HM Mammogram: NORMAL

## 2014-09-27 DIAGNOSIS — H2513 Age-related nuclear cataract, bilateral: Secondary | ICD-10-CM | POA: Diagnosis not present

## 2014-09-27 DIAGNOSIS — H04123 Dry eye syndrome of bilateral lacrimal glands: Secondary | ICD-10-CM | POA: Diagnosis not present

## 2014-09-27 DIAGNOSIS — H25013 Cortical age-related cataract, bilateral: Secondary | ICD-10-CM | POA: Diagnosis not present

## 2014-09-27 DIAGNOSIS — H43393 Other vitreous opacities, bilateral: Secondary | ICD-10-CM | POA: Diagnosis not present

## 2015-01-08 ENCOUNTER — Other Ambulatory Visit: Payer: Self-pay | Admitting: Family Medicine

## 2015-06-07 ENCOUNTER — Telehealth: Payer: Self-pay | Admitting: Family Medicine

## 2015-06-07 NOTE — Telephone Encounter (Signed)
Patient returned Maegen's call.  Patient scheduled appointment on 06/30/15.

## 2015-06-07 NOTE — Telephone Encounter (Signed)
LM for pt to sch AWV with Katha Cabal, [place]

## 2015-06-30 ENCOUNTER — Other Ambulatory Visit: Payer: Self-pay | Admitting: Family Medicine

## 2015-06-30 ENCOUNTER — Encounter: Payer: Self-pay | Admitting: Family Medicine

## 2015-06-30 ENCOUNTER — Ambulatory Visit (INDEPENDENT_AMBULATORY_CARE_PROVIDER_SITE_OTHER): Payer: Medicare Other

## 2015-06-30 VITALS — BP 115/70 | HR 84 | Temp 98.3°F | Ht 62.0 in | Wt 164.2 lb

## 2015-06-30 DIAGNOSIS — E785 Hyperlipidemia, unspecified: Secondary | ICD-10-CM | POA: Diagnosis not present

## 2015-06-30 DIAGNOSIS — Z1231 Encounter for screening mammogram for malignant neoplasm of breast: Secondary | ICD-10-CM

## 2015-06-30 DIAGNOSIS — Z0000 Encounter for general adult medical examination without abnormal findings: Secondary | ICD-10-CM | POA: Diagnosis not present

## 2015-06-30 DIAGNOSIS — Z1239 Encounter for other screening for malignant neoplasm of breast: Secondary | ICD-10-CM

## 2015-06-30 DIAGNOSIS — Z1159 Encounter for screening for other viral diseases: Secondary | ICD-10-CM | POA: Diagnosis not present

## 2015-06-30 DIAGNOSIS — E2839 Other primary ovarian failure: Secondary | ICD-10-CM

## 2015-06-30 LAB — LIPID PANEL
CHOLESTEROL: 167 mg/dL (ref 0–200)
HDL: 52.5 mg/dL (ref >39.00)
LDL Cholesterol: 94 mg/dL (ref 0–99)
NonHDL: 114.59
TRIGLYCERIDES: 104 mg/dL (ref 0.0–149.0)
Total CHOL/HDL Ratio: 3
VLDL: 20.8 mg/dL (ref 0.0–40.0)

## 2015-06-30 LAB — CBC WITH DIFFERENTIAL/PLATELET
Basophils Absolute: 0.1 10*3/uL (ref 0.0–0.1)
Basophils Relative: 1.4 % (ref 0.0–3.0)
EOS PCT: 4 % (ref 0.0–5.0)
Eosinophils Absolute: 0.2 10*3/uL (ref 0.0–0.7)
HCT: 40.2 % (ref 36.0–46.0)
HEMOGLOBIN: 13.8 g/dL (ref 12.0–15.0)
LYMPHS ABS: 2.3 10*3/uL (ref 0.7–4.0)
Lymphocytes Relative: 46.7 % — ABNORMAL HIGH (ref 12.0–46.0)
MCHC: 34.4 g/dL (ref 30.0–36.0)
MCV: 87 fl (ref 78.0–100.0)
MONOS PCT: 6.6 % (ref 3.0–12.0)
Monocytes Absolute: 0.3 10*3/uL (ref 0.1–1.0)
Neutro Abs: 2 10*3/uL (ref 1.4–7.7)
Neutrophils Relative %: 41.3 % — ABNORMAL LOW (ref 43.0–77.0)
Platelets: 348 10*3/uL (ref 150.0–400.0)
RBC: 4.63 Mil/uL (ref 3.87–5.11)
RDW: 13.8 % (ref 11.5–15.5)
WBC: 4.9 10*3/uL (ref 4.0–10.5)

## 2015-06-30 LAB — COMPREHENSIVE METABOLIC PANEL
ALBUMIN: 4.5 g/dL (ref 3.5–5.2)
ALK PHOS: 83 U/L (ref 39–117)
ALT: 27 U/L (ref 0–35)
AST: 26 U/L (ref 0–37)
BUN: 11 mg/dL (ref 6–23)
CALCIUM: 9.8 mg/dL (ref 8.4–10.5)
CO2: 30 mEq/L (ref 19–32)
Chloride: 106 mEq/L (ref 96–112)
Creatinine, Ser: 0.9 mg/dL (ref 0.40–1.20)
GFR: 79.71 mL/min (ref >60.00)
Glucose, Bld: 94 mg/dL (ref 70–99)
POTASSIUM: 4 meq/L (ref 3.5–5.1)
Sodium: 143 mEq/L (ref 135–145)
TOTAL PROTEIN: 7.6 g/dL (ref 6.0–8.3)
Total Bilirubin: 0.3 mg/dL (ref 0.2–1.2)

## 2015-06-30 MED ORDER — ZOSTER VACCINE LIVE 19400 UNT/0.65ML ~~LOC~~ SOLR: 0.6500 mL | Freq: Once | SUBCUTANEOUS | Status: DC

## 2015-06-30 NOTE — Patient Instructions (Signed)
Valerie Paul , Thank you for taking time to come for your Medicare Wellness Visit. I appreciate your ongoing commitment to your health goals. Please review the following plan we discussed and let me know if I can assist you in the future.   These are the goals we discussed: Goals    . Increase physical activity     Starting 06/30/2015, I will continue to exercise for at least 60 minutes 3 days per week.        This is a list of the screening recommended for you and due dates:  Health Maintenance  Topic Date Due  . DEXA scan (bone density measurement)  12/10/2015*  . Cologuard (Stool DNA test)  12/10/2015*  . Shingles Vaccine  06/29/2016*  . Tetanus Vaccine  06/30/2023*  . Flu Shot  10/10/2015  . Mammogram  02/12/2016  . DTaP/Tdap/Td vaccine  Completed  .  Hepatitis C: One time screening is recommended by Center for Disease Control  (CDC) for  adults born from [birth year] through 1965.   Completed  . Pneumonia vaccines  Completed  *Topic was postponed. The date shown is not the original due date.   Preventive Care for Adults  A healthy lifestyle and preventive care can promote health and wellness. Preventive health guidelines for adults include the following key practices.  . A routine yearly physical is a good way to check with your health care provider about your health and preventive screening. It is a chance to share any concerns and updates on your health and to receive a thorough exam.  . Visit your dentist for a routine exam and preventive care every 6 months. Brush your teeth twice a day and floss once a day. Good oral hygiene prevents tooth decay and gum disease.  . The frequency of eye exams is based on your age, health, family medical history, use  of contact lenses, and other factors. Follow your health care provider's ecommendations for frequency of eye exams.  . Eat a healthy diet. Foods like vegetables, fruits, whole grains, low-fat dairy products, and lean protein foods  contain the nutrients you need without too many calories. Decrease your intake of foods high in solid fats, added sugars, and salt. Eat the right amount of calories for you. Get information about a proper diet from your health care provider, if necessary.  . Regular physical exercise is one of the most important things you can do for your health. Most adults should get at least 150 minutes of moderate-intensity exercise (any activity that increases your heart rate and causes you to sweat) each week. In addition, most adults need muscle-strengthening exercises on 2 or more days a week.  Silver Sneakers may be a benefit available to you. To determine eligibility, you may visit the website: www.silversneakers.com or contact program at (816)382-2210 Mon-Fri between 8AM-8PM.   . Maintain a healthy weight. The body mass index (BMI) is a screening tool to identify possible weight problems. It provides an estimate of body fat based on height and weight. Your health care provider can find your BMI and can help you achieve or maintain a healthy weight.   For adults 20 years and older: ? A BMI below 18.5 is considered underweight. ? A BMI of 18.5 to 24.9 is normal. ? A BMI of 25 to 29.9 is considered overweight. ? A BMI of 30 and above is considered obese.   . Maintain normal blood lipids and cholesterol levels by exercising and minimizing your intake of saturated  fat. Eat a balanced diet with plenty of fruit and vegetables. Blood tests for lipids and cholesterol should begin at age 34 and be repeated every 5 years. If your lipid or cholesterol levels are high, you are over 50, or you are at high risk for heart disease, you may need your cholesterol levels checked more frequently. Ongoing high lipid and cholesterol levels should be treated with medicines if diet and exercise are not working.  . If you smoke, find out from your health care provider how to quit. If you do not use tobacco, please do not  start.  . If you choose to drink alcohol, please do not consume more than 2 drinks per day. One drink is considered to be 12 ounces (355 mL) of beer, 5 ounces (148 mL) of wine, or 1.5 ounces (44 mL) of liquor.  . If you are 25-22 years old, ask your health care provider if you should take aspirin to prevent strokes.  . Use sunscreen. Apply sunscreen liberally and repeatedly throughout the day. You should seek shade when your shadow is shorter than you. Protect yourself by wearing long sleeves, pants, a wide-brimmed hat, and sunglasses year round, whenever you are outdoors.  . Once a month, do a whole body skin exam, using a mirror to look at the skin on your back. Tell your health care provider of new moles, moles that have irregular borders, moles that are larger than a pencil eraser, or moles that have changed in shape or color.

## 2015-06-30 NOTE — Progress Notes (Signed)
Pre visit review using our clinic review tool, if applicable. No additional management support is needed unless otherwise documented below in the visit note. 

## 2015-06-30 NOTE — Progress Notes (Signed)
I reviewed health advisor's note, was available for consultation, and agree with documentation and plan.  

## 2015-06-30 NOTE — Progress Notes (Signed)
Subjective:   Valerie Paul is a 70 y.o. female who presents for Medicare Annual (Subsequent) preventive examination.  Cardiac Risk Factors include: advanced age (>77men, >93 women);dyslipidemia;obesity (BMI >30kg/m2)     Objective:     Vitals: BP 115/70 mmHg  Pulse 84  Temp(Src) 98.3 F (36.8 C) (Oral)  Ht 5\' 2"  (1.575 m)  Wt 164 lb 4 oz (74.503 kg)  BMI 30.03 kg/m2  SpO2 97%  Body mass index is 30.03 kg/(m^2).   Tobacco History  Smoking status  . Former Smoker -- 1.00 packs/day  . Start date: 03/12/1963  . Quit date: 03/12/2003  Smokeless tobacco  . Never Used     Counseling given: No   Past Medical History  Diagnosis Date  . History of anemia   . Arthritis   . Seasonal allergies     pollen  . HLD (hyperlipidemia)   . Goiter 2007    s/p benign biopsy, ([facility])  . Infertility 1970s    tube blockage, G0P0   Past Surgical History  Procedure Laterality Date  . Dilation and curettage of uterus    . Laparoscopy  1982  . Myomectomy  1977    fibroids  . Ovarian cyst removal      right  . Appendectomy  1977  . Biopsy thyroid  2006    normal per pt ([facility])   Family History  Problem Relation Age of Onset  . Cancer Brother 37    lung (smoker)  . Cancer Mother 68    brain  . Heart failure Father   . CAD Neg Hx   . Stroke Neg Hx   . Diabetes Neg Hx   . Hypertension Maternal Aunt    History  Sexual Activity  . Sexual Activity: No    Outpatient Encounter Prescriptions as of 06/30/2015  Medication Sig  . aspirin 81 MG tablet Take 81 mg by mouth daily.  Marland Kitchen atorvastatin (LIPITOR) 20 MG tablet Take one tablet daily. **MUST HAVE PHYSICAL FOR FURTHER REFILLS**  . cholecalciferol (VITAMIN D) 1000 UNITS tablet Take 1,000 Units by mouth daily.  Marland Kitchen GARLIC PO Take by mouth daily.  . Multiple Vitamin (MULTIVITAMIN) tablet Take 1 tablet by mouth daily.  . vitamin C (ASCORBIC ACID) 500 MG tablet Take 500 mg by mouth daily.   No facility-administered encounter  medications on file as of 06/30/2015.    Activities of Daily Living In your present state of health, do you have any difficulty performing the following activities: 06/30/2015  Hearing? N  Vision? N  Difficulty concentrating or making decisions? N  Walking or climbing stairs? N  Dressing or bathing? N  Doing errands, shopping? N  Preparing Food and eating ? N  Using the Toilet? N  In the past six months, have you accidently leaked urine? N  Do you have problems with loss of bowel control? N  Managing your Medications? N  Managing your Finances? N  Housekeeping or managing your Housekeeping? N    Patient Care Team: Ria Bush, MD as PCP - General (Family Medicine)    Assessment:    Exercise Activities and Dietary recommendations Current Exercise Habits: Home exercise routine;Structured exercise class, Type of exercise: strength training/weights;walking;Other - see comments (line dancing, zumba), Time (Minutes): 60, Frequency (Times/Week): 3, Weekly Exercise (Minutes/Week): 180, Intensity: Moderate, Exercise limited by: None identified  Goals    . Increase physical activity     Starting 06/30/2015, I will continue to exercise for at least 60 minutes 3  days per week.       Fall Risk Fall Risk  06/30/2015 01/12/2014 01/08/2013  Falls in the past year? No No No   Depression Screen PHQ 2/9 Scores 06/30/2015 01/12/2014 01/08/2013  PHQ - 2 Score 0 0 0     Cognitive Testing MMSE - Mini Mental State Exam 06/30/2015  Orientation to time 5  Orientation to Place 5  Registration 3  Attention/ Calculation 0  Recall 3  Language- name 2 objects 0  Language- repeat 1  Language- follow 3 step command 3  Language- read & follow direction 0  Write a sentence 0  Copy design 0  Total score 20   PLEASE NOTE: A Mini-Cog screen was completed. Maximum score is 20. A value of 0 denotes this part of [place] MMSE was not completed.  Orientation to Time - Max 5 Orientation to Place - Max  5 Registration - Max 3 Recall - Max 3 Language Repeat - Max 1 Language Follow 3 Step Command - Max 3   Immunization History  Administered Date(s) Administered  . Influenza,inj,Quad PF,36+ Mos 01/08/2013  . Influenza-Unspecified 12/01/2013  . Pneumococcal Conjugate-13 01/12/2014  . Pneumococcal Polysaccharide-23 01/08/2013   Screening Tests Health Maintenance  Topic Date Due  . DEXA SCAN  12/10/2015 (Originally 12/04/2010)  . Fecal DNA (Cologuard)  12/10/2015 (Originally 12/04/1995)  . ZOSTAVAX  06/29/2016 (Originally 12/03/2005)  . TETANUS/TDAP  06/30/2023 (Originally 12/03/1964)  . INFLUENZA VACCINE  10/10/2015  . MAMMOGRAM  02/12/2016  . DTaP/Tdap/Td  Completed  . Hepatitis C Screening  Completed  . PNA vac Low Risk Adult  Completed      Plan:     I have personally reviewed and addressed the Medicare Annual Wellness questionnaire and have noted the following in the patient's chart:  A. Medical and social history B. Use of alcohol, tobacco or illicit drugs  C. Current medications and supplements D. Functional ability and status E.  Nutritional status F.  Physical activity G. Advance directives H. List of other physicians I.  Hospitalizations, surgeries, and ER visits in previous 12 months J.  [place] to include hearing, vision, cognitive, depression L. Referrals and appointments - mammogram, density with [facility] on 08/10/2015  In addition, I have reviewed and discussed with patient certain preventive protocols, quality metrics, and best practice recommendations. A written personalized care plan for preventive services as well as general preventive health recommendations were provided to patient.  See attached scanned questionnaire for additional information.   Signed,   Lindell Noe, MHA, BS, LPN Health Advisor

## 2015-07-01 LAB — HEPATITIS C ANTIBODY: HCV AB: NEGATIVE

## 2015-07-11 ENCOUNTER — Encounter: Payer: Self-pay | Admitting: Family Medicine

## 2015-07-11 ENCOUNTER — Ambulatory Visit (INDEPENDENT_AMBULATORY_CARE_PROVIDER_SITE_OTHER): Payer: Medicare Other | Admitting: Family Medicine

## 2015-07-11 ENCOUNTER — Other Ambulatory Visit (HOSPITAL_COMMUNITY)
Admission: RE | Admit: 2015-07-11 | Discharge: 2015-07-11 | Disposition: A | Discharge status: Home / Self Care | Payer: Medicare Other | Source: Ambulatory Visit | Attending: Family Medicine | Admitting: Family Medicine

## 2015-07-11 VITALS — BP 128/70 | HR 92 | Temp 98.1°F | Wt 165.0 lb

## 2015-07-11 DIAGNOSIS — Z124 Encounter for screening for malignant neoplasm of cervix: Secondary | ICD-10-CM | POA: Insufficient documentation

## 2015-07-11 DIAGNOSIS — I700 Atherosclerosis of aorta: Secondary | ICD-10-CM | POA: Diagnosis not present

## 2015-07-11 DIAGNOSIS — Z01419 Encounter for gynecological examination (general) (routine) without abnormal findings: Secondary | ICD-10-CM | POA: Diagnosis not present

## 2015-07-11 DIAGNOSIS — E785 Hyperlipidemia, unspecified: Secondary | ICD-10-CM | POA: Diagnosis not present

## 2015-07-11 LAB — HM PAP SMEAR: HM Pap smear: NORMAL

## 2015-07-11 MED ORDER — ATORVASTATIN CALCIUM 20 MG PO TABS: ORAL_TABLET | ORAL | Status: DC

## 2015-07-11 NOTE — Progress Notes (Signed)
BP 128/70 mmHg  Pulse 92  Temp(Src) 98.1 F (36.7 C) (Oral)  Wt 165 lb (74.844 kg)   CC: f/u visit  Subjective:    Patient ID: Valerie Paul, female    DOB: Jan 07, 1946, 70 y.o.   MRN: [id number]  HPI: Valerie Paul is a 70 y.o. female presenting on 07/11/2015 for Follow-up   Saw Katha Cabal last week for wellness visit. Here for /fu visit.   HLD - complaint with lipitor without myalgias.   Preventative: Pap last 1997. normal 2014. Agrees to rpt today Mammogram - [facility] 1. 02/2014. Breast exams at home.  Colon cancer - discussed, has signed up for cologuard Dexa - done, per pt normal but unsure where Has mammo/dexa scheduled 08/10/2015. Flu shot - done Pneumovax - today. zostavax - expensive. May get at pharmacy. Will let us know.  Advanced directives - packet provided today. Would want husband to be HCPOA.  G0P0   Lives with husband, no pets  Occupation: retired, was Engineer, mining  Edu: BS business administration  Activity: exercise class 3x/wk, walking  Diet: good water, some fruits/vegetables   Relevant past medical, surgical, family and social history reviewed and updated as indicated. Interim medical history since our last visit reviewed. Allergies and medications reviewed and updated. Current Outpatient Prescriptions on File Prior to Visit  Medication Sig  . aspirin 81 MG tablet Take 81 mg by mouth daily.  . cholecalciferol (VITAMIN D) 1000 UNITS tablet Take 1,000 Units by mouth daily.  Marland Kitchen GARLIC PO Take by mouth daily.  . Multiple Vitamin (MULTIVITAMIN) tablet Take 1 tablet by mouth daily.  . vitamin C (ASCORBIC ACID) 500 MG tablet Take 500 mg by mouth daily.  Marland Kitchen zoster vaccine live, PF, (ZOSTAVAX) 16109 UNT/0.65ML injection Inject 19,400 Units into the skin once. (Patient not taking: Reported on 07/11/2015)   No current facility-administered medications on file prior to visit.    Review of Systems Per HPI unless specifically indicated in ROS section       Objective:    BP 128/70 mmHg  Pulse 92  Temp(Src) 98.1 F (36.7 C) (Oral)  Wt 165 lb (74.844 kg)  Wt Readings from Last 3 Encounters:  07/11/15 165 lb (74.844 kg)  06/30/15 164 lb 4 oz (74.503 kg)  01/12/14 165 lb 8 oz (75.07 kg)    Physical Exam  Constitutional: She appears well-developed and well-nourished. No distress.  HENT:  Mouth/Throat: Oropharynx is clear and moist. No oropharyngeal exudate.  Cardiovascular: Normal rate, regular rhythm, normal heart sounds and intact distal pulses.   No murmur heard. Pulmonary/Chest: Effort normal and breath sounds normal. No respiratory distress. She has no wheezes. She has no rales.  Genitourinary: Vagina normal and uterus normal. Pelvic exam was performed with patient supine. There is no rash, tenderness or lesion on the right labia. There is no rash, tenderness or lesion on the left labia. Cervix exhibits no motion tenderness and no friability. Right adnexum displays no mass, no tenderness and no fullness. Left adnexum displays no mass, no tenderness and no fullness.  Pap performed on cervix  Musculoskeletal: She exhibits no edema.  Skin: Skin is warm and dry. No rash noted.  Psychiatric: She has a normal mood and affect.  Nursing note and vitals reviewed.  Results for orders placed or performed in visit on 06/30/15  CBC with Differential  Result Value Ref Range   WBC 4.9 4.0 - 10.5 K/uL   RBC 4.63 3.87 - 5.11 Mil/uL   Hemoglobin 13.8 12.0 -  15.0 g/dL   HCT 40.2 36.0 - 46.0 %   MCV 87.0 78.0 - 100.0 fl   MCHC 34.4 30.0 - 36.0 g/dL   RDW 13.8 11.5 - 15.5 %   Platelets 348.0 150.0 - 400.0 K/uL   Neutrophils Relative % 41.3 (L) 43.0 - 77.0 %   Lymphocytes Relative 46.7 (H) 12.0 - 46.0 %   Monocytes Relative 6.6 3.0 - 12.0 %   Eosinophils Relative 4.0 0.0 - 5.0 %   Basophils Relative 1.4 0.0 - 3.0 %   Neutro Abs 2.0 1.4 - 7.7 K/uL   Lymphs Abs 2.3 0.7 - 4.0 K/uL   Monocytes Absolute 0.3 0.1 - 1.0 K/uL   Eosinophils Absolute 0.2  0.0 - 0.7 K/uL   Basophils Absolute 0.1 0.0 - 0.1 K/uL  Hepatitis C Antibody  Result Value Ref Range   HCV Ab NEGATIVE NEGATIVE  Comprehensive metabolic panel  Result Value Ref Range   Sodium 143 135 - 145 mEq/L   Potassium 4.0 3.5 - 5.1 mEq/L   Chloride 106 96 - 112 mEq/L   CO2 30 19 - 32 mEq/L   Glucose, Bld 94 70 - 99 mg/dL   BUN 11 6 - 23 mg/dL   Creatinine, Ser 0.90 0.40 - 1.20 mg/dL   Total Bilirubin 0.3 0.2 - 1.2 mg/dL   Alkaline Phosphatase 83 39 - 117 U/L   AST 26 0 - 37 U/L   ALT 27 0 - 35 U/L   Total Protein 7.6 6.0 - 8.3 g/dL   Albumin 4.5 3.5 - 5.2 g/dL   Calcium 9.8 8.4 - 10.5 mg/dL   GFR 79.71 >60.00 mL/min  Lipid Panel  Result Value Ref Range   Cholesterol 167 0 - 200 mg/dL   Triglycerides 104.0 0.0 - 149.0 mg/dL   HDL 52.50 >39.00 mg/dL   VLDL 20.8 0.0 - 40.0 mg/dL   LDL Cholesterol 94 0 - 99 mg/dL   Total CHOL/HDL Ratio 3    NonHDL 114.59       Assessment & Plan:   Problem List Items Addressed This Visit    HLD (hyperlipidemia) - Primary    Chronic, stable. Continue lipitor 20mg  daily.      Relevant Medications   atorvastatin (LIPITOR) 20 MG tablet   Abdominal aortic atherosclerosis (HCC)    Continue aspirin, statin.       Relevant Medications   atorvastatin (LIPITOR) 20 MG tablet   Encounter for routine gynecological examination    Pelvic exam/pap smear performed today.  Very healthy 70yo. If normal pap, discussed aging out of cervical cancer screening.          Follow up plan: Return in about 1 year (around 07/10/2016), or if symptoms worsen or fail to improve, for follow up visit.  Ria Bush, MD

## 2015-07-11 NOTE — Assessment & Plan Note (Signed)
Continue aspirin, statin.  

## 2015-07-11 NOTE — Progress Notes (Signed)
Pre visit review using our clinic review tool, if applicable. No additional management support is needed unless otherwise documented below in the visit note. 

## 2015-07-11 NOTE — Assessment & Plan Note (Signed)
Chronic, stable. Continue lipitor 20mg daily. 

## 2015-07-11 NOTE — Addendum Note (Signed)
Addended by: Royann Shivers A on: 07/11/2015 05:13 PM   Modules accepted: Orders

## 2015-07-11 NOTE — Assessment & Plan Note (Signed)
Pelvic exam/pap smear performed today.  Very healthy 70yo. If normal pap, discussed aging out of cervical cancer screening.

## 2015-07-11 NOTE — Patient Instructions (Signed)
You are doing well today. Return as needed or in 1 year for next check up/med refill visit  Menopause is a normal process in which your reproductive ability comes to an end. This process happens gradually over a span of months to years, usually between the ages of 40 and 62. Menopause is complete when you have missed 12 consecutive menstrual periods. It is important to talk with your health care provider about some of the most common conditions that affect postmenopausal women, such as heart disease, cancer, and bone loss (osteoporosis). Adopting a healthy lifestyle and getting preventive care can help to promote your health and wellness. Those actions can also lower your chances of developing some of these common conditions. WHAT SHOULD I KNOW ABOUT MENOPAUSE? During menopause, you may experience a number of symptoms, such as:  Moderate-to-severe hot flashes.  Night sweats.  Decrease in sex drive.  Mood swings.  Headaches.  Tiredness.  Irritability.  Memory problems.  Insomnia. Choosing to treat or not to treat menopausal changes is an individual decision that you make with your health care provider. WHAT SHOULD I KNOW ABOUT HORMONE REPLACEMENT THERAPY AND SUPPLEMENTS? Hormone therapy products are effective for treating symptoms that are associated with menopause, such as hot flashes and night sweats. Hormone replacement carries certain risks, especially as you become older. If you are thinking about using estrogen or estrogen with progestin treatments, discuss the benefits and risks with your health care provider. WHAT SHOULD I KNOW ABOUT HEART DISEASE AND STROKE? Heart disease, heart attack, and stroke become more likely as you age. This may be due, in part, to the hormonal changes that your body experiences during menopause. These can affect how your body processes dietary fats, triglycerides, and cholesterol. Heart attack and stroke are both medical emergencies. There are many  things that you can do to help prevent heart disease and stroke:  Have your blood pressure checked at least every 1-2 years. High blood pressure causes heart disease and increases the risk of stroke.  If you are 82-40 years old, ask your health care provider if you should take aspirin to prevent a heart attack or a stroke.  Do not use any tobacco products, including cigarettes, chewing tobacco, or electronic cigarettes. If you need help quitting, ask your health care provider.  It is important to eat a healthy diet and maintain a healthy weight.  Be sure to include plenty of vegetables, fruits, low-fat dairy products, and lean protein.  Avoid eating foods that are high in solid fats, added sugars, or salt (sodium).  Get regular exercise. This is one of the most important things that you can do for your health.  Try to exercise for at least 150 minutes each week. The type of exercise that you do should increase your heart rate and make you sweat. This is known as moderate-intensity exercise.  Try to do strengthening exercises at least twice each week. Do these in addition to the moderate-intensity exercise.  Know your numbers.Ask your health care provider to check your cholesterol and your blood glucose. Continue to have your blood tested as directed by your health care provider. WHAT SHOULD I KNOW ABOUT CANCER SCREENING? There are several types of cancer. Take the following steps to reduce your risk and to catch any cancer development as early as possible. Breast Cancer  Practice breast self-awareness.  This means understanding how your breasts normally appear and feel.  It also means doing regular breast self-exams. Let your health care provider know  about any changes, no matter how small.  If you are 38 or older, have a clinician do a breast exam (clinical breast exam or CBE) every year. Depending on your age, family history, and medical history, it may be recommended that you also  have a yearly breast X-ray (mammogram).  If you have a family history of breast cancer, talk with your health care provider about genetic screening.  If you are at high risk for breast cancer, talk with your health care provider about having an MRI and a mammogram every year.  Breast cancer (BRCA) gene test is recommended for women who have family members with BRCA-related cancers. Results of the assessment will determine the need for genetic counseling and BRCA1 and for BRCA2 testing. BRCA-related cancers include these types:  Breast. This occurs in males or females.  Ovarian.  Tubal. This may also be called fallopian tube cancer.  Cancer of the abdominal or pelvic lining (peritoneal cancer).  Prostate.  Pancreatic. Cervical, Uterine, and Ovarian Cancer Your health care provider may recommend that you be screened regularly for cancer of the pelvic organs. These include your ovaries, uterus, and vagina. This screening involves a pelvic exam, which includes checking for microscopic changes to the surface of your cervix (Pap test).  For women ages 21-65, health care providers may recommend a pelvic exam and a Pap test every three years. For women ages 57-65, they may recommend the Pap test and pelvic exam, combined with testing for human papilloma virus (HPV), every five years. Some types of HPV increase your risk of cervical cancer. Testing for HPV may also be done on women of any age who have unclear Pap test results.  Other health care providers may not recommend any screening for nonpregnant women who are considered low risk for pelvic cancer and have no symptoms. Ask your health care provider if a screening pelvic exam is right for you.  If you have had past treatment for cervical cancer or a condition that could lead to cancer, you need Pap tests and screening for cancer for at least 20 years after your treatment. If Pap tests have been discontinued for you, your risk factors (such as  having a new sexual partner) need to be reassessed to determine if you should start having screenings again. Some women have medical problems that increase the chance of getting cervical cancer. In these cases, your health care provider may recommend that you have screening and Pap tests more often.  If you have a family history of uterine cancer or ovarian cancer, talk with your health care provider about genetic screening.  If you have vaginal bleeding after reaching menopause, tell your health care provider.  There are currently no reliable tests available to screen for ovarian cancer. Lung Cancer Lung cancer screening is recommended for adults 20-54 years old who are at high risk for lung cancer because of a history of smoking. A yearly low-dose CT scan of the lungs is recommended if you:  Currently smoke.  Have a history of at least 30 pack-years of smoking and you currently smoke or have quit within the past 15 years. A pack-year is smoking an average of one pack of cigarettes per day for one year. Yearly screening should:  Continue until it has been 15 years since you quit.  Stop if you develop a health problem that would prevent you from having lung cancer treatment. Colorectal Cancer  This type of cancer can be detected and can often be prevented.  Routine colorectal cancer screening usually begins at age 43 and continues through age 45.  If you have risk factors for colon cancer, your health care provider may recommend that you be screened at an earlier age.  If you have a family history of colorectal cancer, talk with your health care provider about genetic screening.  Your health care provider may also recommend using home test kits to check for hidden blood in your stool.  A small camera at the end of a tube can be used to examine your colon directly (sigmoidoscopy or colonoscopy). This is done to check for the earliest forms of colorectal cancer.  Direct examination of  the colon should be repeated every 5-10 years until age 41. However, if early forms of precancerous polyps or small growths are found or if you have a family history or genetic risk for colorectal cancer, you may need to be screened more often. Skin Cancer  Check your skin from head to toe regularly.  Monitor any moles. Be sure to tell your health care provider:  About any new moles or changes in moles, especially if there is a change in a mole's shape or color.  If you have a mole that is larger than the size of a pencil eraser.  If any of your family members has a history of skin cancer, especially at a young age, talk with your health care provider about genetic screening.  Always use sunscreen. Apply sunscreen liberally and repeatedly throughout the day.  Whenever you are outside, protect yourself by wearing long sleeves, pants, a wide-brimmed hat, and sunglasses. WHAT SHOULD I KNOW ABOUT OSTEOPOROSIS? Osteoporosis is a condition in which bone destruction happens more quickly than new bone creation. After menopause, you may be at an increased risk for osteoporosis. To help prevent osteoporosis or the bone fractures that can happen because of osteoporosis, the following is recommended:  If you are 63-29 years old, get at least 1,000 mg of calcium and at least 600 mg of vitamin D per day.  If you are older than age 65 but younger than age 60, get at least 1,200 mg of calcium and at least 600 mg of vitamin D per day.  If you are older than age 42, get at least 1,200 mg of calcium and at least 800 mg of vitamin D per day. Smoking and excessive alcohol intake increase the risk of osteoporosis. Eat foods that are rich in calcium and vitamin D, and do weight-bearing exercises several times each week as directed by your health care provider. WHAT SHOULD I KNOW ABOUT HOW MENOPAUSE AFFECTS [place]? Depression may occur at any age, but it is more common as you become older. Common  symptoms of depression include:  Low or sad mood.  Changes in sleep patterns.  Changes in appetite or eating patterns.  Feeling an overall lack of motivation or enjoyment of activities that you previously enjoyed.  Frequent crying spells. Talk with your health care provider if you think that you are experiencing depression. WHAT SHOULD I KNOW ABOUT IMMUNIZATIONS? It is important that you get and maintain your immunizations. These include:  Tetanus, diphtheria, and pertussis (Tdap) booster vaccine.  Influenza every year before the flu season begins.  Pneumonia vaccine.  Shingles vaccine. Your health care provider may also recommend other immunizations.   This information is not intended to replace advice given to you by your health care provider. Make sure you discuss any questions you have with your health care  provider.   Document Released: 04/19/2005 Document Revised: 03/18/2014 Document Reviewed: 10/28/2013 Elsevier Interactive Patient Education [employer].

## 2015-07-13 LAB — CYTOLOGY - PAP

## 2015-07-14 ENCOUNTER — Encounter: Payer: Self-pay | Admitting: *Deleted

## 2015-08-10 ENCOUNTER — Ambulatory Visit
Admission: RE | Admit: 2015-08-10 | Discharge: 2015-08-10 | Disposition: A | Discharge status: Home / Self Care | Payer: Medicare Other | Source: Ambulatory Visit | Attending: Family Medicine | Admitting: Family Medicine

## 2015-08-10 DIAGNOSIS — Z1231 Encounter for screening mammogram for malignant neoplasm of breast: Secondary | ICD-10-CM

## 2015-08-10 DIAGNOSIS — Z13820 Encounter for screening for osteoporosis: Secondary | ICD-10-CM | POA: Diagnosis not present

## 2015-08-10 DIAGNOSIS — Z780 Asymptomatic menopausal state: Secondary | ICD-10-CM | POA: Diagnosis not present

## 2015-08-10 DIAGNOSIS — E2839 Other primary ovarian failure: Secondary | ICD-10-CM

## 2015-08-10 HISTORY — PX: OTHER SURGICAL HISTORY: SHX169

## 2015-08-11 LAB — HM MAMMOGRAPHY

## 2015-08-14 ENCOUNTER — Encounter: Payer: Self-pay | Admitting: *Deleted

## 2015-08-16 ENCOUNTER — Encounter: Payer: Self-pay | Admitting: Family Medicine

## 2015-09-04 ENCOUNTER — Telehealth: Payer: Self-pay | Admitting: Family Medicine

## 2015-09-04 NOTE — Telephone Encounter (Signed)
received notice from [employer] that patient has not returned cologuard after 2 months. plz call and encourage to return kit.

## 2015-09-05 NOTE — Telephone Encounter (Signed)
Spoke with patient and reminded her to complete test. She said she will do it as soon as she gets back from vacation.

## 2015-10-03 DIAGNOSIS — H2513 Age-related nuclear cataract, bilateral: Secondary | ICD-10-CM | POA: Diagnosis not present

## 2015-10-03 DIAGNOSIS — H25013 Cortical age-related cataract, bilateral: Secondary | ICD-10-CM | POA: Diagnosis not present

## 2015-10-03 DIAGNOSIS — H43393 Other vitreous opacities, bilateral: Secondary | ICD-10-CM | POA: Diagnosis not present

## 2015-10-03 DIAGNOSIS — H04123 Dry eye syndrome of bilateral lacrimal glands: Secondary | ICD-10-CM | POA: Diagnosis not present

## 2016-01-20 ENCOUNTER — Encounter (HOSPITAL_COMMUNITY): Payer: Self-pay

## 2016-01-20 ENCOUNTER — Emergency Department (HOSPITAL_COMMUNITY)
Admission: EM | Admit: 2016-01-20 | Discharge: 2016-01-20 | Disposition: A | Discharge status: Home / Self Care | Payer: No Typology Code available for payment source | Attending: Emergency Medicine | Admitting: Emergency Medicine

## 2016-01-20 ENCOUNTER — Emergency Department (HOSPITAL_COMMUNITY): Payer: No Typology Code available for payment source

## 2016-01-20 DIAGNOSIS — Z7982 Long term (current) use of aspirin: Secondary | ICD-10-CM | POA: Diagnosis not present

## 2016-01-20 DIAGNOSIS — R51 Headache: Secondary | ICD-10-CM | POA: Insufficient documentation

## 2016-01-20 DIAGNOSIS — Y92410 Unspecified street and highway as the place of occurrence of the external cause: Secondary | ICD-10-CM | POA: Insufficient documentation

## 2016-01-20 DIAGNOSIS — Z87891 Personal history of nicotine dependence: Secondary | ICD-10-CM | POA: Insufficient documentation

## 2016-01-20 DIAGNOSIS — Z79899 Other long term (current) drug therapy: Secondary | ICD-10-CM | POA: Diagnosis not present

## 2016-01-20 DIAGNOSIS — M25512 Pain in left shoulder: Secondary | ICD-10-CM | POA: Diagnosis not present

## 2016-01-20 DIAGNOSIS — S4992XA Unspecified injury of left shoulder and upper arm, initial encounter: Secondary | ICD-10-CM | POA: Insufficient documentation

## 2016-01-20 DIAGNOSIS — Y999 Unspecified external cause status: Secondary | ICD-10-CM | POA: Diagnosis not present

## 2016-01-20 DIAGNOSIS — S299XXA Unspecified injury of thorax, initial encounter: Secondary | ICD-10-CM | POA: Diagnosis not present

## 2016-01-20 DIAGNOSIS — Y9389 Activity, other specified: Secondary | ICD-10-CM | POA: Diagnosis not present

## 2016-01-20 MED ORDER — ACETAMINOPHEN 325 MG PO TABS
650.0000 mg | ORAL_TABLET | Freq: Once | ORAL | Status: AC
  Administered 2016-01-20: 650 mg via ORAL
  Filled 2016-01-20: qty 2

## 2016-01-20 NOTE — ED Notes (Signed)
Patient transported to X-Ray/CT.
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
## 2016-02-06 ENCOUNTER — Encounter: Payer: Self-pay | Admitting: Family Medicine

## 2016-02-06 ENCOUNTER — Ambulatory Visit (INDEPENDENT_AMBULATORY_CARE_PROVIDER_SITE_OTHER): Payer: Medicare Other | Admitting: Family Medicine

## 2016-02-06 VITALS — BP 132/80 | HR 102 | Temp 97.7°F | Resp 18 | Ht 62.0 in | Wt 167.0 lb

## 2016-02-06 DIAGNOSIS — Z23 Encounter for immunization: Secondary | ICD-10-CM | POA: Diagnosis not present

## 2016-02-06 DIAGNOSIS — S2341XD Sprain of ribs, subsequent encounter: Secondary | ICD-10-CM | POA: Diagnosis not present
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
## 2016-02-06 NOTE — Addendum Note (Signed)
Addended by: Ralph Dowdy on: 02/06/2016 02:54 PM   Modules accepted: Orders

## 2016-02-06 NOTE — Assessment & Plan Note (Signed)
Anticipate MSK cause like rib strain - slowly healing.  rec aleve 220mg  nightly x 3-5 nights then PRN rec heating pad. Update if not improved with treatment. Pt agrees with plan.

## 2016-02-06 NOTE — Progress Notes (Signed)
Pre visit review using our clinic review tool, if applicable. No additional management support is needed unless otherwise documented below in the visit note. 

## 2016-07-20 ENCOUNTER — Other Ambulatory Visit: Payer: Self-pay | Admitting: Family Medicine

## 2016-07-22 NOTE — Telephone Encounter (Signed)
Pt did have a hospital f/u on 02/06/16, but she hasn't had any recent lipid labs (in over a year) and no future appts. scheduled, please advise

## 2016-10-07 ENCOUNTER — Other Ambulatory Visit: Payer: Self-pay

## 2016-10-11 DIAGNOSIS — H04123 Dry eye syndrome of bilateral lacrimal glands: Secondary | ICD-10-CM | POA: Diagnosis not present

## 2016-10-11 DIAGNOSIS — H43393 Other vitreous opacities, bilateral: Secondary | ICD-10-CM | POA: Diagnosis not present

## 2016-10-11 DIAGNOSIS — H524 Presbyopia: Secondary | ICD-10-CM | POA: Diagnosis not present

## 2016-10-11 DIAGNOSIS — H25013 Cortical age-related cataract, bilateral: Secondary | ICD-10-CM | POA: Diagnosis not present

## 2016-10-11 DIAGNOSIS — H2513 Age-related nuclear cataract, bilateral: Secondary | ICD-10-CM | POA: Diagnosis not present

## 2016-10-31 ENCOUNTER — Encounter: Payer: Self-pay | Admitting: Family Medicine

## 2016-10-31 NOTE — Telephone Encounter (Signed)
error 

## 2016-11-14 ENCOUNTER — Other Ambulatory Visit: Payer: Self-pay | Admitting: Family Medicine

## 2016-11-14 DIAGNOSIS — E785 Hyperlipidemia, unspecified: Secondary | ICD-10-CM

## 2016-11-14 DIAGNOSIS — E049 Nontoxic goiter, unspecified: Secondary | ICD-10-CM

## 2016-11-15 ENCOUNTER — Ambulatory Visit (INDEPENDENT_AMBULATORY_CARE_PROVIDER_SITE_OTHER): Payer: Medicare Other

## 2016-11-15 VITALS — BP 118/70 | HR 79 | Temp 98.4°F | Ht 61.5 in | Wt 168.0 lb

## 2016-11-15 DIAGNOSIS — E049 Nontoxic goiter, unspecified: Secondary | ICD-10-CM

## 2016-11-15 DIAGNOSIS — Z0000 Encounter for general adult medical examination without abnormal findings: Secondary | ICD-10-CM

## 2016-11-15 DIAGNOSIS — E785 Hyperlipidemia, unspecified: Secondary | ICD-10-CM

## 2016-11-15 LAB — LIPID PANEL
CHOLESTEROL: 156 mg/dL (ref 0–200)
HDL: 43 mg/dL (ref >39.00)
LDL Cholesterol: 83 mg/dL (ref 0–99)
NONHDL: 112.85
TRIGLYCERIDES: 147 mg/dL (ref 0.0–149.0)
Total CHOL/HDL Ratio: 4
VLDL: 29.4 mg/dL (ref 0.0–40.0)

## 2016-11-15 LAB — BASIC METABOLIC PANEL
BUN: 13 mg/dL (ref 6–23)
CHLORIDE: 103 meq/L (ref 96–112)
CO2: 29 meq/L (ref 19–32)
CREATININE: 1.04 mg/dL (ref 0.40–1.20)
Calcium: 9.9 mg/dL (ref 8.4–10.5)
GFR: 67.19 mL/min (ref >60.00)
GLUCOSE: 93 mg/dL (ref 70–99)
POTASSIUM: 4.3 meq/L (ref 3.5–5.1)
Sodium: 140 mEq/L (ref 135–145)

## 2016-11-15 LAB — TSH: TSH: 0.95 u[IU]/mL (ref 0.35–4.50)

## 2016-11-15 NOTE — Progress Notes (Signed)
Pre visit review using our clinic review tool, if applicable. No additional management support is needed unless otherwise documented below in the visit note. 

## 2016-11-15 NOTE — Progress Notes (Signed)
PCP notes:   Health maintenance:  Flu vaccine - addressed Cologuard - per pt, kit is expired  Abnormal screenings:   Depression score: 2  Patient concerns:   Intermittent right knee pain. Pain scale: 5/10. Onset: 2 mths ago.  Nurse concerns:  None  Next PCP appt:   11/18/16 @ 1530

## 2016-11-15 NOTE — Progress Notes (Signed)
Subjective:   Valerie Paul is a 71 y.o. female who presents for Medicare Annual (Subsequent) preventive examination.  Review of Systems:  N/A Cardiac Risk Factors include: obesity (BMI >30kg/m2);advanced age (>103men, >32 women);dyslipidemia     Objective:     Vitals: BP 118/70 (BP Location: Right Arm, Patient Position: Sitting, Cuff Size: Normal)   Pulse 79   Temp 98.4 F (36.9 C) (Oral)   Ht 5' 1.5" (1.562 m) Comment: no shoes  Wt 168 lb (76.2 kg)   SpO2 99%   BMI 31.23 kg/m   Body mass index is 31.23 kg/m.   Tobacco History  Smoking Status  . Former Smoker  . Packs/day: 1.00  . Start date: 03/12/1963  . Quit date: 03/12/2003  Smokeless Tobacco  . Never Used     Counseling given: No   Past Medical History:  Diagnosis Date  . Arthritis   . 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 History of anemia   . HLD (hyperlipidemia)   . Infertility 1970s   tube blockage, G0P0  . Seasonal allergies    pollen   Past Surgical History:  Procedure Laterality Date  . APPENDECTOMY  1977  . BIOPSY THYROID  2006   normal per pt ([facility])  . DEXA  08/2015   WNL  . DILATION AND CURETTAGE OF UTERUS    . LAPAROSCOPY  1982  . MYOMECTOMY  1977   fibroids  . OVARIAN CYST REMOVAL     right   Family History  Problem Relation Age of Onset  . Cancer Brother 71       lung (smoker)  . Cancer Mother 68       brain  . Heart failure Father   . Hypertension Maternal Aunt   . CAD Neg Hx   . Stroke Neg Hx   . Diabetes Neg Hx    History  Sexual Activity  . Sexual activity: No    Outpatient Encounter Prescriptions as of 11/15/2016  Medication Sig  . aspirin 81 MG tablet Take 81 mg by mouth daily.  Marland Kitchen atorvastatin (LIPITOR) 20 MG tablet TAKE 1 TABLET BY MOUTH DAILY  . cholecalciferol (VITAMIN D) 1000 UNITS tablet Take 1,000 Units by mouth daily.  Marland Kitchen GARLIC PO Take by mouth daily.  . Multiple Vitamin (MULTIVITAMIN) tablet Take 1 tablet by mouth daily.  . vitamin C (ASCORBIC  ACID) 500 MG tablet Take 500 mg by mouth daily.  Marland Kitchen zoster vaccine live, PF, (ZOSTAVAX) 27035 UNT/0.65ML injection Inject 19,400 Units into the skin once. (Patient not taking: Reported on 02/06/2016)   No facility-administered encounter medications on file as of 11/15/2016.     Activities of Daily Living In your present state of health, do you have any difficulty performing the following activities: 11/15/2016  Hearing? N  Vision? N  Difficulty concentrating or making decisions? N  Walking or climbing stairs? N  Dressing or bathing? N  Doing errands, shopping? N  Preparing Food and eating ? N  Using the Toilet? N  In the past six months, have you accidently leaked urine? N  Do you have problems with loss of bowel control? N  Managing your Medications? N  Managing your Finances? N  Housekeeping or managing your Housekeeping? N  Some recent data might be hidden    Patient Care Team: Ria Bush, MD as PCP - General (Family Medicine)    Assessment:     Hearing Screening   125Hz  250Hz  500Hz  1000Hz  2000Hz   3000Hz  4000Hz  6000Hz  8000Hz   Right ear:   40 40 40  40    Left ear:   40 40 40  40    Vision Screening Comments: Last vision exam in July 2018 with Dr. Kerin Ransom   Exercise Activities and Dietary recommendations Current Exercise Habits: Home exercise routine, Type of exercise: walking, Time (Minutes): 60, Frequency (Times/Week): 3, Weekly Exercise (Minutes/Week): 180, Intensity: Mild, Exercise limited by: None identified  Goals    . Increase physical activity          Starting 11/15/2016, I will continue to exercise for at least 60 minutes 3 days per week.       Fall Risk Fall Risk  11/15/2016 06/30/2015 01/12/2014 01/08/2013  Falls in the past year? No No No No   Depression Screen PHQ 2/9 Scores 11/15/2016 06/30/2015 01/12/2014 01/08/2013  PHQ - 2 Score 0 0 0 0  PHQ- 9 Score 2 - - -     Cognitive Function MMSE - Mini Mental State Exam 11/15/2016 06/30/2015  Orientation to time  5 5  Orientation to Place 5 5  Registration 3 3  Attention/ Calculation 0 0  Recall 3 3  Language- name 2 objects 0 0  Language- repeat 1 1  Language- follow 3 step command 3 3  Language- read & follow direction 0 0  Write a sentence 0 0  Copy design 0 0  Total score 20 20       PLEASE NOTE: A Mini-Cog screen was completed. Maximum score is 20. A value of 0 denotes this part of Folstein MMSE was not completed or the patient failed this part of the Mini-Cog screening.   Mini-Cog Screening Orientation to Time - Max 5 pts Orientation to Place - Max 5 pts Registration - Max 3 pts Recall - Max 3 pts Language Repeat - Max 1 pts Language Follow 3 Step Command - Max 3 pts   Immunization History  Administered Date(s) Administered  . Influenza,inj,Quad PF,6+ Mos 01/08/2013, 02/06/2016  . Influenza-Unspecified 12/01/2013  . Pneumococcal Conjugate-13 01/12/2014  . Pneumococcal Polysaccharide-23 01/08/2013   Screening Tests Health Maintenance  Topic Date Due  . INFLUENZA VACCINE  06/08/2017 (Originally 10/09/2016)  . Fecal DNA (Cologuard)  11/16/2019 (Originally 12/04/1995)  . DTaP/Tdap/Td (1 - Tdap) 06/30/2023 (Originally 12/03/1964)  . TETANUS/TDAP  06/30/2023 (Originally 12/03/1964)  . MAMMOGRAM  08/10/2017  . DEXA SCAN  Completed  . Hepatitis C Screening  Completed  . PNA vac Low Risk Adult  Completed      Plan:     I have personally reviewed and addressed the Medicare Annual Wellness questionnaire and have noted the following in the patient's chart:  A. Medical and social history B. Use of alcohol, tobacco or illicit drugs  C. Current medications and supplements D. Functional ability and status E.  Nutritional status F.  Physical activity G. Advance directives H. List of other physicians I.  Hospitalizations, surgeries, and ER visits in previous 12 months J.  Keeler to include hearing, vision, cognitive, depression L. Referrals and appointments - none  In  addition, I have reviewed and discussed with patient certain preventive protocols, quality metrics, and best practice recommendations. A written personalized care plan for preventive services as well as general preventive health recommendations were provided to patient.  See attached scanned questionnaire for additional information.   Signed,   Lindell Noe, MHA, BS, LPN Health Coach

## 2016-11-15 NOTE — Patient Instructions (Signed)
Valerie Paul , Thank you for taking time to come for your Medicare Wellness Visit. I appreciate your ongoing commitment to your health goals. Please review the following plan we discussed and let me know if I can assist you in the future.   These are the goals we discussed: Goals    . Increase physical activity          Starting 11/15/2016, I will continue to exercise for at least 60 minutes 3 days per week.        This is a list of the screening recommended for you and due dates:  Health Maintenance  Topic Date Due  . Flu Shot  06/08/2017*  . Cologuard (Stool DNA test)  11/16/2019*  . DTaP/Tdap/Td vaccine (1 - Tdap) 06/30/2023*  . Tetanus Vaccine  06/30/2023*  . Mammogram  08/10/2017  . DEXA scan (bone density measurement)  Completed  .  Hepatitis C: One time screening is recommended by Center for Disease Control  (CDC) for  adults born from [birth year] through 1965.   Completed  . Pneumonia vaccines  Completed  *Topic was postponed. The date shown is not the original due date.   Preventive Care for Adults  A healthy lifestyle and preventive care can promote health and wellness. Preventive health guidelines for adults include the following key practices.  . A routine yearly physical is a good way to check with your health care provider about your health and preventive screening. It is a chance to share any concerns and updates on your health and to receive a thorough exam.  . Visit your dentist for a routine exam and preventive care every 6 months. Brush your teeth twice a day and floss once a day. Good oral hygiene prevents tooth decay and gum disease.  . The frequency of eye exams is based on your age, health, family medical history, use  of contact lenses, and other factors. Follow your health care provider's ecommendations for frequency of eye exams.  . Eat a healthy diet. Foods like vegetables, fruits, whole grains, low-fat dairy products, and lean protein foods contain the nutrients  you need without too many calories. Decrease your intake of foods high in solid fats, added sugars, and salt. Eat the right amount of calories for you. Get information about a proper diet from your health care provider, if necessary.  . Regular physical exercise is one of the most important things you can do for your health. Most adults should get at least 150 minutes of moderate-intensity exercise (any activity that increases your heart rate and causes you to sweat) each week. In addition, most adults need muscle-strengthening exercises on 2 or more days a week.  Silver Sneakers may be a benefit available to you. To determine eligibility, you may visit the website: www.silversneakers.com or contact program at 830-670-4989 Mon-Fri between 8AM-8PM.   . Maintain a healthy weight. The body mass index (BMI) is a screening tool to identify possible weight problems. It provides an estimate of body fat based on height and weight. Your health care provider can find your BMI and can help you achieve or maintain a healthy weight.   For adults 20 years and older: ? A BMI below 18.5 is considered underweight. ? A BMI of 18.5 to 24.9 is normal. ? A BMI of 25 to 29.9 is considered overweight. ? A BMI of 30 and above is considered obese.   . Maintain normal blood lipids and cholesterol levels by exercising and minimizing your intake of  saturated fat. Eat a balanced diet with plenty of fruit and vegetables. Blood tests for lipids and cholesterol should begin at age 31 and be repeated every 5 years. If your lipid or cholesterol levels are high, you are over 50, or you are at high risk for heart disease, you may need your cholesterol levels checked more frequently. Ongoing high lipid and cholesterol levels should be treated with medicines if diet and exercise are not working.  . If you smoke, find out from your health care provider how to quit. If you do not use tobacco, please do not start.  . If you choose to  drink alcohol, please do not consume more than 2 drinks per day. One drink is considered to be 12 ounces (355 mL) of beer, 5 ounces (148 mL) of wine, or 1.5 ounces (44 mL) of liquor.  . If you are 11-27 years old, ask your health care provider if you should take aspirin to prevent strokes.  . Use sunscreen. Apply sunscreen liberally and repeatedly throughout the day. You should seek shade when your shadow is shorter than you. Protect yourself by wearing long sleeves, pants, a wide-brimmed hat, and sunglasses year round, whenever you are outdoors.  . Once a month, do a whole body skin exam, using a mirror to look at the skin on your back. Tell your health care provider of new moles, moles that have irregular borders, moles that are larger than a pencil eraser, or moles that have changed in shape or color.

## 2016-11-18 ENCOUNTER — Ambulatory Visit (INDEPENDENT_AMBULATORY_CARE_PROVIDER_SITE_OTHER): Payer: Medicare Other | Admitting: Family Medicine

## 2016-11-18 ENCOUNTER — Encounter: Payer: Self-pay | Admitting: Family Medicine

## 2016-11-18 VITALS — BP 122/74 | HR 93 | Temp 97.9°F | Ht 62.0 in | Wt 168.0 lb

## 2016-11-18 DIAGNOSIS — Z7189 Other specified counseling: Secondary | ICD-10-CM | POA: Diagnosis not present

## 2016-11-18 DIAGNOSIS — I700 Atherosclerosis of aorta: Secondary | ICD-10-CM

## 2016-11-18 DIAGNOSIS — M7071 Other bursitis of hip, right hip: Secondary | ICD-10-CM | POA: Insufficient documentation

## 2016-11-18 DIAGNOSIS — E785 Hyperlipidemia, unspecified: Secondary | ICD-10-CM | POA: Diagnosis not present

## 2016-11-18 DIAGNOSIS — M7061 Trochanteric bursitis, right hip: Secondary | ICD-10-CM

## 2016-11-18 NOTE — Progress Notes (Signed)
I reviewed health advisor's note, was available for consultation, and agree with documentation and plan.  

## 2016-11-18 NOTE — Assessment & Plan Note (Signed)
Continue aspirin, statin.  

## 2016-11-18 NOTE — Patient Instructions (Addendum)
Check with cologuard about getting new kit mailed to you.  Call and schedule mammogram. Flu shot next month.  Work on Scientist, physiological. For hip bursitis - try exercises provided today including lateral leg raises and let us know if not improving with treatment.  Return as needed or in 1 year for next medicare wellness visit with Katha Cabal and follow up with me.   Health Maintenance, Female Adopting a healthy lifestyle and getting preventive care can go a long way to promote health and wellness. Talk with your health care provider about what schedule of regular examinations is right for you. This is a good chance for you to check in with your provider about disease prevention and staying healthy. In between checkups, there are plenty of things you can do on your own. Experts have done a lot of research about which lifestyle changes and preventive measures are most likely to keep you healthy. Ask your health care provider for more information. Weight and diet Eat a healthy diet  Be sure to include plenty of vegetables, fruits, low-fat dairy products, and lean protein.  Do not eat a lot of foods high in solid fats, added sugars, or salt.  Get regular exercise. This is one of the most important things you can do for your health. ? Most adults should exercise for at least 150 minutes each week. The exercise should increase your heart rate and make you sweat (moderate-intensity exercise). ? Most adults should also do strengthening exercises at least twice a week. This is in addition to the moderate-intensity exercise.  Maintain a healthy weight  Body mass index (BMI) is a measurement that can be used to identify possible weight problems. It estimates body fat based on height and weight. Your health care provider can help determine your BMI and help you achieve or maintain a healthy weight.  For females 71 years of age and older: ? A BMI below 18.5 is considered underweight. ? A BMI of 18.5 to 24.9  is normal. ? A BMI of 25 to 29.9 is considered overweight. ? A BMI of 30 and above is considered obese.  Watch levels of cholesterol and blood lipids  You should start having your blood tested for lipids and cholesterol at 71 years of age, then have this test every 5 years.  You may need to have your cholesterol levels checked more often if: ? Your lipid or cholesterol levels are high. ? You are older than 71 years of age. ? You are at high risk for heart disease.  Cancer screening Lung Cancer  Lung cancer screening is recommended for adults 30-63 years old who are at high risk for lung cancer because of a history of smoking.  A yearly low-dose CT scan of the lungs is recommended for people who: ? Currently smoke. ? Have quit within the past 15 years. ? Have at least a 30-pack-year history of smoking. A pack year is smoking an average of one pack of cigarettes a day for 1 year.  Yearly screening should continue until it has been 15 years since you quit.  Yearly screening should stop if you develop a health problem that would prevent you from having lung cancer treatment.  Breast Cancer  Practice breast self-awareness. This means understanding how your breasts normally appear and feel.  It also means doing regular breast self-exams. Let your health care provider know about any changes, no matter how small.  If you are in your 20s or 30s, you should have  a clinical breast exam (CBE) by a health care provider every 1-3 years as part of a regular health exam.  If you are 38 or older, have a CBE every year. Also consider having a breast X-ray (mammogram) every year.  If you have a family history of breast cancer, talk to your health care provider about genetic screening.  If you are at high risk for breast cancer, talk to your health care provider about having an MRI and a mammogram every year.  Breast cancer gene (BRCA) assessment is recommended for women who have family members  with BRCA-related cancers. BRCA-related cancers include: ? Breast. ? Ovarian. ? Tubal. ? Peritoneal cancers.  Results of the assessment will determine the need for genetic counseling and BRCA1 and BRCA2 testing.  Cervical Cancer Your health care provider may recommend that you be screened regularly for cancer of the pelvic organs (ovaries, uterus, and vagina). This screening involves a pelvic examination, including checking for microscopic changes to the surface of your cervix (Pap test). You may be encouraged to have this screening done every 3 years, beginning at age 15.  For women ages 61-65, health care providers may recommend pelvic exams and Pap testing every 3 years, or they may recommend the Pap and pelvic exam, combined with testing for human papilloma virus (HPV), every 5 years. Some types of HPV increase your risk of cervical cancer. Testing for HPV may also be done on women of any age with unclear Pap test results.  Other health care providers may not recommend any screening for nonpregnant women who are considered low risk for pelvic cancer and who do not have symptoms. Ask your health care provider if a screening pelvic exam is right for you.  If you have had past treatment for cervical cancer or a condition that could lead to cancer, you need Pap tests and screening for cancer for at least 20 years after your treatment. If Pap tests have been discontinued, your risk factors (such as having a new sexual partner) need to be reassessed to determine if screening should resume. Some women have medical problems that increase the chance of getting cervical cancer. In these cases, your health care provider may recommend more frequent screening and Pap tests.  Colorectal Cancer  This type of cancer can be detected and often prevented.  Routine colorectal cancer screening usually begins at 71 years of age and continues through 71 years of age.  Your health care provider may recommend  screening at an earlier age if you have risk factors for colon cancer.  Your health care provider may also recommend using home test kits to check for hidden blood in the stool.  A small camera at the end of a tube can be used to examine your colon directly (sigmoidoscopy or colonoscopy). This is done to check for the earliest forms of colorectal cancer.  Routine screening usually begins at age 35.  Direct examination of the colon should be repeated every 5-10 years through 71 years of age. However, you may need to be screened more often if early forms of precancerous polyps or small growths are found.  Skin Cancer  Check your skin from head to toe regularly.  Tell your health care provider about any new moles or changes in moles, especially if there is a change in a mole's shape or color.  Also tell your health care provider if you have a mole that is larger than the size of a pencil eraser.  Always use  sunscreen. Apply sunscreen liberally and repeatedly throughout the day.  Protect yourself by wearing long sleeves, pants, a wide-brimmed hat, and sunglasses whenever you are outside.  Heart disease, diabetes, and high blood pressure  High blood pressure causes heart disease and increases the risk of stroke. High blood pressure is more likely to develop in: ? People who have blood pressure in the high end of the normal range (130-139/85-89 mm Hg). ? People who are overweight or obese. ? People who are African American.  If you are 20-67 years of age, have your blood pressure checked every 3-5 years. If you are 78 years of age or older, have your blood pressure checked every year. You should have your blood pressure measured twice-once when you are at a hospital or clinic, and once when you are not at a hospital or clinic. Record the average of the two measurements. To check your blood pressure when you are not at a hospital or clinic, you can use: ? An automated blood pressure machine at  a pharmacy. ? A home blood pressure monitor.  If you are between 55 years and 13 years old, ask your health care provider if you should take aspirin to prevent strokes.  Have regular diabetes screenings. This involves taking a blood sample to check your fasting blood sugar level. ? If you are at a normal weight and have a low risk for diabetes, have this test once every three years after 71 years of age. ? If you are overweight and have a high risk for diabetes, consider being tested at a younger age or more often. Preventing infection Hepatitis B  If you have a higher risk for hepatitis B, you should be screened for this virus. You are considered at high risk for hepatitis B if: ? You were born in a country where hepatitis B is common. Ask your health care provider which countries are considered high risk. ? Your parents were born in a high-risk country, and you have not been immunized against hepatitis B (hepatitis B vaccine). ? You have HIV or AIDS. ? You use needles to inject street drugs. ? You live with someone who has hepatitis B. ? You have had sex with someone who has hepatitis B. ? You get hemodialysis treatment. ? You take certain medicines for conditions, including cancer, organ transplantation, and autoimmune conditions.  Hepatitis C  Blood testing is recommended for: ? Everyone born from [birth year] through 1965. ? Anyone with known risk factors for hepatitis C.  Sexually transmitted infections (STIs)  You should be screened for sexually transmitted infections (STIs) including gonorrhea and chlamydia if: ? You are sexually active and are younger than 71 years of age. ? You are older than 71 years of age and your health care provider tells you that you are at risk for this type of infection. ? Your sexual activity has changed since you were last screened and you are at an increased risk for chlamydia or gonorrhea. Ask your health care provider if you are at risk.  If you do  not have HIV, but are at risk, it may be recommended that you take a prescription medicine daily to prevent HIV infection. This is called pre-exposure prophylaxis (PrEP). You are considered at risk if: ? You are sexually active and do not regularly use condoms or know the HIV status of your partner(s). ? You take drugs by injection. ? You are sexually active with a partner who has HIV.  Talk with your health  care provider about whether you are at high risk of being infected with HIV. If you choose to begin PrEP, you should first be tested for HIV. You should then be tested every 3 months for as long as you are taking PrEP. Pregnancy  If you are premenopausal and you may become pregnant, ask your health care provider about preconception counseling.  If you may become pregnant, take 400 to 800 micrograms (mcg) of folic acid every day.  If you want to prevent pregnancy, talk to your health care provider about birth control (contraception). Osteoporosis and menopause  Osteoporosis is a disease in which the bones lose minerals and strength with aging. This can result in serious bone fractures. Your risk for osteoporosis can be identified using a bone density scan.  If you are 19 years of age or older, or if you are at risk for osteoporosis and fractures, ask your health care provider if you should be screened.  Ask your health care provider whether you should take a calcium or vitamin D supplement to lower your risk for osteoporosis.  Menopause may have certain physical symptoms and risks.  Hormone replacement therapy may reduce some of these symptoms and risks. Talk to your health care provider about whether hormone replacement therapy is right for you. Follow these instructions at home:  Schedule regular health, dental, and eye exams.  Stay current with your immunizations.  Do not use any tobacco products including cigarettes, chewing tobacco, or electronic cigarettes.  If you are  pregnant, do not drink alcohol.  If you are breastfeeding, limit how much and how often you drink alcohol.  Limit alcohol intake to no more than 1 drink per day for nonpregnant women. One drink equals 12 ounces of beer, 5 ounces of wine, or 1 ounces of hard liquor.  Do not use street drugs.  Do not share needles.  Ask your health care provider for help if you need support or information about quitting drugs.  Tell your health care provider if you often feel depressed.  Tell your health care provider if you have ever been abused or do not feel safe at home. This information is not intended to replace advice given to you by your health care provider. Make sure you discuss any questions you have with your health care provider. Document Released: 09/10/2010 Document Revised: 08/03/2015 Document Reviewed: 11/29/2014 Elsevier Interactive Patient Education  [employer].

## 2016-11-18 NOTE — Assessment & Plan Note (Signed)
Anticipate trochanteric bursitis of R hip - will mail stretching exercises. Recommended heating pad and lateral leg raises.

## 2016-11-18 NOTE — Assessment & Plan Note (Signed)
Advanced directives - packet provided last year. Would want husband to be HCPOA.

## 2016-11-18 NOTE — Assessment & Plan Note (Addendum)
Chronic, stable. Continue lipitor 20mg  daily.  The 10-year ASCVD risk score [name], et al., 2013) is: 8.4%   Values used to calculate the score:     Age: 71 years     Sex: Female     Is Non-Hispanic African American: Yes     Diabetic: No     Tobacco smoker: No     Systolic Blood Pressure: 149 mmHg     Is BP treated: No     HDL Cholesterol: 43 mg/dL     Total Cholesterol: 156 mg/dL

## 2016-11-18 NOTE — Progress Notes (Signed)
BP 122/74 (BP Location: Left Arm, Patient Position: Sitting, Cuff Size: Normal)   Pulse 93   Temp 97.9 F (36.6 C) (Oral)   Ht _0  (1.575 m)   Wt 168 lb (76.2 kg)   SpO2 96%   BMI 30.73 kg/m    CC: AMW f/u visit Subjective:    Patient ID: Valerie Paul, female    DOB: Jan 10, 1946, 71 y.o.   MRN: [id number]  HPI: Valerie Paul is a 71 y.o. female presenting on 11/18/2016 for Medicare Wellness   Saw Katha Cabal last week for medicare wellness visit. Note reviewed.   Tired today because she walked 1.5 miles then 1 hr line dance class.   Intermittent R thigh pain over last few months. Anterior thigh pain - actually improving since she's been more active. No numbness or tingling of leg. Has previously seen rheumatologist.   Preventative: Well woman exam - normal pap smear 07/2015. Could age out - pt will consider.  Mammogram - Birads 1 08/2015. Breast exams at home. She will reschedule. Colon cancer - cologuard kit expired. Would like repeated.  Dexa - done, per pt normal but unsure where Has mammo/dexa scheduled 08/10/2015. Flu shot yearly Pneumovax - 2014, prevnar 2015 shingrix - discussed.  Advanced directives - packet provided last year. Would want husband to be HCPOA.  Seat belt use discussed. Sunscreen use discussed. No changing moles on skin.  Non smoker. Second hand smoke exposure Alcohol - rare G0P0   Lives with husband, no pets  Occupation: retired, was Engineer, mining  Edu: BS business administration  Activity: exercise class 3x/wk, walking  Diet: good water, some fruits/vegetables   Relevant past medical, surgical, family and social history reviewed and updated as indicated. Interim medical history since our last visit reviewed. Allergies and medications reviewed and updated. Outpatient Medications Prior to Visit  Medication Sig Dispense Refill  . aspirin 81 MG tablet Take 81 mg by mouth daily.    Marland Kitchen atorvastatin (LIPITOR) 20 MG tablet TAKE 1 TABLET BY MOUTH  DAILY 90 tablet 1  . cholecalciferol (VITAMIN D) 1000 UNITS tablet Take 1,000 Units by mouth daily.    Marland Kitchen GARLIC PO Take by mouth daily.    . Multiple Vitamin (MULTIVITAMIN) tablet Take 1 tablet by mouth daily.    . vitamin C (ASCORBIC ACID) 500 MG tablet Take 500 mg by mouth daily.    Marland Kitchen zoster vaccine live, PF, (ZOSTAVAX) 22979 UNT/0.65ML injection Inject 19,400 Units into the skin once. 1 each 0   No facility-administered medications prior to visit.      Per HPI unless specifically indicated in ROS section below Review of Systems     Objective:    BP 122/74 (BP Location: Left Arm, Patient Position: Sitting, Cuff Size: Normal)   Pulse 93   Temp 97.9 F (36.6 C) (Oral)   Ht _1  (1.575 m)   Wt 168 lb (76.2 kg)   SpO2 96%   BMI 30.73 kg/m   Wt Readings from Last 3 Encounters:  11/18/16 168 lb (76.2 kg)  11/15/16 168 lb (76.2 kg)  02/06/16 167 lb (75.8 kg)    Physical Exam  Constitutional: She is oriented to person, place, and time. She appears well-developed and well-nourished. No distress.  HENT:  Head: Normocephalic and atraumatic.  Right Ear: Hearing, tympanic membrane, external ear and ear canal normal.  Left Ear: Hearing, tympanic membrane, external ear and ear canal normal.  Nose: Nose normal.  Mouth/Throat: Uvula is midline, oropharynx is clear and  moist and mucous membranes are normal. No oropharyngeal exudate, posterior oropharyngeal edema or posterior oropharyngeal erythema.  Eyes: Pupils are equal, round, and reactive to light. Conjunctivae and EOM are normal. No scleral icterus.  Neck: Normal range of motion. Neck supple. Carotid bruit is not present. No thyromegaly present.  Cardiovascular: Normal rate, regular rhythm, normal heart sounds and intact distal pulses.   No murmur heard. Pulses:      Radial pulses are 2+ on the right side, and 2+ on the left side.  Pulmonary/Chest: Effort normal and breath sounds normal. No respiratory distress. She has no wheezes.  She has no rales.  Abdominal: Soft. Bowel sounds are normal. She exhibits no distension and no mass. There is no tenderness. There is no rebound and no guarding.  Musculoskeletal: Normal range of motion. She exhibits no edema.  Pain to palpation at R GTB. FROM at knee in flexion/extension No pain with int/ext rotation at hip  Lymphadenopathy:    She has no cervical adenopathy.  Neurological: She is alert and oriented to person, place, and time.  CN grossly intact, station and gait intact  Skin: Skin is warm and dry. No rash noted.  Psychiatric: She has a normal mood and affect. Her behavior is normal. Judgment and thought content normal.  Nursing note and vitals reviewed.  Results for orders placed or performed in visit on 11/15/16  Lipid panel  Result Value Ref Range   Cholesterol 156 0 - 200 mg/dL   Triglycerides 147.0 0.0 - 149.0 mg/dL   HDL 43.00 >39.00 mg/dL   VLDL 29.4 0.0 - 40.0 mg/dL   LDL Cholesterol 83 0 - 99 mg/dL   Total CHOL/HDL Ratio 4    NonHDL 112.85   TSH  Result Value Ref Range   TSH 0.95 0.35 - 4.50 uIU/mL  Basic metabolic panel  Result Value Ref Range   Sodium 140 135 - 145 mEq/L   Potassium 4.3 3.5 - 5.1 mEq/L   Chloride 103 96 - 112 mEq/L   CO2 29 19 - 32 mEq/L   Glucose, Bld 93 70 - 99 mg/dL   BUN 13 6 - 23 mg/dL   Creatinine, Ser 1.04 0.40 - 1.20 mg/dL   Calcium 9.9 8.4 - 10.5 mg/dL   GFR 67.19 >60.00 mL/min      Assessment & Plan:  Over 25 minutes were spent face-to-face with the patient during this encounter and >50% of that time was spent on counseling and coordination of care  Problem List Items Addressed This Visit    Abdominal aortic atherosclerosis (HCC)    Continue aspirin, statin.       Advanced care planning/counseling discussion    Advanced directives - packet provided last year. Would want husband to be HCPOA.       Bursitis of right hip - Primary    Anticipate trochanteric bursitis of R hip - will mail stretching exercises.  Recommended heating pad and lateral leg raises.      HLD (hyperlipidemia)    Chronic, stable. Continue lipitor 90m daily.  The 10-year ASCVD risk score (Mikey BussingDC JBrooke Bonito, et al., 2013) is: 8.4%   Values used to calculate the score:     Age: [age]years     Sex: Female     Is Non-Hispanic African American: Yes     Diabetic: No     Tobacco smoker: No     Systolic Blood Pressure: 1940mmHg     Is BP treated: No  HDL Cholesterol: 43 mg/dL     Total Cholesterol: 156 mg/dL           Follow up plan: Return in about 1 year (around 11/18/2017) for medicare wellness visit.  Ria Bush, MD

## 2017-02-10 ENCOUNTER — Other Ambulatory Visit: Payer: Self-pay | Admitting: Family Medicine

## 2017-10-17 DIAGNOSIS — H25013 Cortical age-related cataract, bilateral: Secondary | ICD-10-CM | POA: Diagnosis not present

## 2017-10-17 DIAGNOSIS — H524 Presbyopia: Secondary | ICD-10-CM | POA: Diagnosis not present

## 2017-10-17 DIAGNOSIS — H2513 Age-related nuclear cataract, bilateral: Secondary | ICD-10-CM | POA: Diagnosis not present

## 2017-10-17 DIAGNOSIS — H04123 Dry eye syndrome of bilateral lacrimal glands: Secondary | ICD-10-CM | POA: Diagnosis not present

## 2017-10-17 DIAGNOSIS — H43393 Other vitreous opacities, bilateral: Secondary | ICD-10-CM | POA: Diagnosis not present

## 2017-11-12 ENCOUNTER — Other Ambulatory Visit: Payer: Self-pay | Admitting: Family Medicine

## 2017-11-19 ENCOUNTER — Ambulatory Visit: Payer: Medicare Other

## 2017-11-19 ENCOUNTER — Other Ambulatory Visit: Payer: Self-pay | Admitting: Family Medicine

## 2017-11-19 DIAGNOSIS — Z01419 Encounter for gynecological examination (general) (routine) without abnormal findings: Secondary | ICD-10-CM

## 2017-11-19 DIAGNOSIS — E049 Nontoxic goiter, unspecified: Secondary | ICD-10-CM

## 2017-11-19 DIAGNOSIS — E785 Hyperlipidemia, unspecified: Secondary | ICD-10-CM

## 2017-11-21 ENCOUNTER — Encounter: Payer: Medicare Other | Admitting: Family Medicine

## 2017-12-17 ENCOUNTER — Ambulatory Visit (INDEPENDENT_AMBULATORY_CARE_PROVIDER_SITE_OTHER): Payer: Medicare Other

## 2017-12-17 VITALS — BP 104/62 | HR 87 | Temp 98.0°F | Ht 61.5 in | Wt 166.8 lb

## 2017-12-17 DIAGNOSIS — Z0000 Encounter for general adult medical examination without abnormal findings: Secondary | ICD-10-CM

## 2017-12-17 DIAGNOSIS — Z23 Encounter for immunization: Secondary | ICD-10-CM | POA: Diagnosis not present

## 2017-12-17 DIAGNOSIS — E049 Nontoxic goiter, unspecified: Secondary | ICD-10-CM | POA: Diagnosis not present

## 2017-12-17 DIAGNOSIS — E785 Hyperlipidemia, unspecified: Secondary | ICD-10-CM

## 2017-12-17 LAB — LIPID PANEL
CHOLESTEROL: 150 mg/dL (ref 0–200)
HDL: 44.1 mg/dL (ref >39.00)
LDL Cholesterol: 80 mg/dL (ref 0–99)
NonHDL: 105.93
Total CHOL/HDL Ratio: 3
Triglycerides: 132 mg/dL (ref 0.0–149.0)
VLDL: 26.4 mg/dL (ref 0.0–40.0)

## 2017-12-17 LAB — COMPREHENSIVE METABOLIC PANEL
ALBUMIN: 4.5 g/dL (ref 3.5–5.2)
ALK PHOS: 74 U/L (ref 39–117)
ALT: 16 U/L (ref 0–35)
AST: 17 U/L (ref 0–37)
BUN: 11 mg/dL (ref 6–23)
CO2: 32 mEq/L (ref 19–32)
Calcium: 9.8 mg/dL (ref 8.4–10.5)
Chloride: 104 mEq/L (ref 96–112)
Creatinine, Ser: 0.97 mg/dL (ref 0.40–1.20)
GFR: 72.59 mL/min (ref >60.00)
Glucose, Bld: 82 mg/dL (ref 70–99)
POTASSIUM: 4.1 meq/L (ref 3.5–5.1)
Sodium: 141 mEq/L (ref 135–145)
TOTAL PROTEIN: 7.9 g/dL (ref 6.0–8.3)
Total Bilirubin: 0.4 mg/dL (ref 0.2–1.2)

## 2017-12-17 LAB — TSH: TSH: 1.5 u[IU]/mL (ref 0.35–4.50)

## 2017-12-17 NOTE — Patient Instructions (Signed)
Valerie Paul , Thank you for taking time to come for your Medicare Wellness Visit. I appreciate your ongoing commitment to your health goals. Please review the following plan we discussed and let me know if I can assist you in the future.   These are the goals we discussed: Goals    . Increase physical activity     Starting 12/17/2017, I will continue to exercise for at least 4 hours 3 days per week.        This is a list of the screening recommended for you and due dates:  Health Maintenance  Topic Date Due  . Mammogram  03/11/2019*  . Cologuard (Stool DNA test)  11/16/2019*  . DTaP/Tdap/Td vaccine (1 - Tdap) 06/30/2023*  . Tetanus Vaccine  06/30/2023*  . Flu Shot  Completed  . DEXA scan (bone density measurement)  Completed  .  Hepatitis C: One time screening is recommended by Center for Disease Control  (CDC) for  adults born from [birth year] through 1965.   Completed  . Pneumonia vaccines  Completed  *Topic was postponed. The date shown is not the original due date.   Preventive Care for Adults  A healthy lifestyle and preventive care can promote health and wellness. Preventive health guidelines for adults include the following key practices.  . A routine yearly physical is a good way to check with your health care provider about your health and preventive screening. It is a chance to share any concerns and updates on your health and to receive a thorough exam.  . Visit your dentist for a routine exam and preventive care every 6 months. Brush your teeth twice a day and floss once a day. Good oral hygiene prevents tooth decay and gum disease.  . The frequency of eye exams is based on your age, health, family medical history, use  of contact lenses, and other factors. Follow your health care provider's recommendations for frequency of eye exams.  . Eat a healthy diet. Foods like vegetables, fruits, whole grains, low-fat dairy products, and lean protein foods contain the nutrients you need  without too many calories. Decrease your intake of foods high in solid fats, added sugars, and salt. Eat the right amount of calories for you. Get information about a proper diet from your health care provider, if necessary.  . Regular physical exercise is one of the most important things you can do for your health. Most adults should get at least 150 minutes of moderate-intensity exercise (any activity that increases your heart rate and causes you to sweat) each week. In addition, most adults need muscle-strengthening exercises on 2 or more days a week.  Silver Sneakers may be a benefit available to you. To determine eligibility, you may visit the website: www.silversneakers.com or contact program at 703-232-5875 Mon-Fri between 8AM-8PM.   . Maintain a healthy weight. The body mass index (BMI) is a screening tool to identify possible weight problems. It provides an estimate of body fat based on height and weight. Your health care provider can find your BMI and can help you achieve or maintain a healthy weight.   For adults 20 years and older: ? A BMI below 18.5 is considered underweight. ? A BMI of 18.5 to 24.9 is normal. ? A BMI of 25 to 29.9 is considered overweight. ? A BMI of 30 and above is considered obese.   . Maintain normal blood lipids and cholesterol levels by exercising and minimizing your intake of saturated fat. Eat a balanced  diet with plenty of fruit and vegetables. Blood tests for lipids and cholesterol should begin at age 2 and be repeated every 5 years. If your lipid or cholesterol levels are high, you are over 50, or you are at high risk for heart disease, you may need your cholesterol levels checked more frequently. Ongoing high lipid and cholesterol levels should be treated with medicines if diet and exercise are not working.  . If you smoke, find out from your health care provider how to quit. If you do not use tobacco, please do not start.  . If you choose to drink  alcohol, please do not consume more than 2 drinks per day. One drink is considered to be 12 ounces (355 mL) of beer, 5 ounces (148 mL) of wine, or 1.5 ounces (44 mL) of liquor.  . If you are 76-79 years old, ask your health care provider if you should take aspirin to prevent strokes.  . Use sunscreen. Apply sunscreen liberally and repeatedly throughout the day. You should seek shade when your shadow is shorter than you. Protect yourself by wearing long sleeves, pants, a wide-brimmed hat, and sunglasses year round, whenever you are outdoors.  . Once a month, do a whole body skin exam, using a mirror to look at the skin on your back. Tell your health care provider of new moles, moles that have irregular borders, moles that are larger than a pencil eraser, or moles that have changed in shape or color.

## 2017-12-17 NOTE — Progress Notes (Signed)
PCP notes:   Health maintenance:  Flu vaccine - administered Mammogram - addressed  Abnormal screenings:   None  Patient concerns:   None  Nurse concerns:  None  Next PCP appt:   12/19/17 @ 0930

## 2017-12-17 NOTE — Progress Notes (Signed)
Subjective:   Valerie Paul is a 72 y.o. female who presents for Medicare Annual (Subsequent) preventive examination.  Review of Systems:  N/A Cardiac Risk Factors include: advanced age (>60men, >89 women);dyslipidemia     Objective:     Vitals: BP 104/62 (BP Location: Right Arm, Patient Position: Sitting, Cuff Size: Normal)   Pulse 87   Temp 98 F (36.7 C) (Oral)   Ht 5' 1.5" (1.562 m) Comment: no shoes  Wt 166 lb 12 oz (75.6 kg)   SpO2 99%   BMI 31.00 kg/m   Body mass index is 31 kg/m.  Advanced Directives 12/17/2017 11/15/2016 01/20/2016 06/30/2015  Does Patient Have a Medical Advance Directive? No No No No  Would patient like information on creating a medical advance directive? No - Patient declined - No - patient declined information No - patient declined information    Tobacco Social History   Tobacco Use  Smoking Status Former Smoker  . Packs/day: 1.00  . Start date: 03/12/1963  . Last attempt to quit: 03/12/2003  . Years since quitting: 14.7  Smokeless Tobacco Never Used     Counseling given: No   Clinical Intake:  Pre-visit preparation completed: Yes  Pain : No/denies pain Pain Score: 0-No pain     Nutritional Status: BMI > 30  Obese Nutritional Risks: None Diabetes: No  How often do you need to have someone help you when you read instructions, pamphlets, or other written materials from your doctor or pharmacy?: 1 - Never What is the last grade level you completed in school?: Bachelor degree  Interpreter Needed?: No  Comments: pt lives with spouse Information entered by :: LPinson, LPN  Past Medical History:  Diagnosis Date  . Arthritis   . 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 History of anemia   . HLD (hyperlipidemia)   . Infertility 1970s   tube blockage, G0P0  . Seasonal allergies    pollen   Past Surgical History:  Procedure Laterality Date  . APPENDECTOMY  1977  . BIOPSY THYROID  2006   normal per pt ([facility])  . DEXA   08/2015   WNL  . DILATION AND CURETTAGE OF UTERUS    . LAPAROSCOPY  1982  . MYOMECTOMY  1977   fibroids  . OVARIAN CYST REMOVAL     right   Family History  Problem Relation Age of Onset  . Cancer Brother 60       lung (smoker)  . Cancer Mother 47       brain  . Heart failure Father   . Hypertension Maternal Aunt   . CAD Neg Hx   . Stroke Neg Hx   . Diabetes Neg Hx    Social History   Socioeconomic History  . Marital status: Married    Spouse name: Not on file  . Number of children: Not on file  . Years of education: Not on file  . Highest education level: Not on file  Occupational History  . Not on file  Social Needs  . Financial resource strain: Not on file  . Food insecurity:    Worry: Not on file    Inability: Not on file  . Transportation needs:    Medical: Not on file    Non-medical: Not on file  Tobacco Use  . Smoking status: Former Smoker    Packs/day: 1.00    Start date: 03/12/1963    Last attempt to quit: 03/12/2003  Years since quitting: 14.7  . Smokeless tobacco: Never Used  Substance and Sexual Activity  . Alcohol use: Yes    Comment: Rare  . Drug use: No  . Sexual activity: Never  Lifestyle  . Physical activity:    Days per week: Not on file    Minutes per session: Not on file  . Stress: Not on file  Relationships  . Social connections:    Talks on phone: Not on file    Gets together: Not on file    Attends religious service: Not on file    Active member of club or organization: Not on file    Attends meetings of clubs or organizations: Not on file    Relationship status: Not on file  Other Topics Concern  . Not on file  Social History Narrative   Lives with husband, no pets   Occupation: retired, was Engineer, mining   Edu: BS business administration   Activity: exercise class 3x/wk, walking    Diet: good water, some fruits/vegetables    Outpatient Encounter Medications as of 12/17/2017  Medication Sig  . aspirin 81 MG tablet Take  81 mg by mouth daily.  Marland Kitchen atorvastatin (LIPITOR) 20 MG tablet TAKE 1 TABLET BY MOUTH EVERY DAY  . cholecalciferol (VITAMIN D) 1000 UNITS tablet Take 1,000 Units by mouth daily.  Marland Kitchen GARLIC PO Take by mouth daily.  . Multiple Vitamin (MULTIVITAMIN) tablet Take 1 tablet by mouth daily.  . vitamin C (ASCORBIC ACID) 500 MG tablet Take 500 mg by mouth daily.   No facility-administered encounter medications on file as of 12/17/2017.     Activities of Daily Living In your present state of health, do you have any difficulty performing the following activities: 12/17/2017  Hearing? N  Vision? N  Difficulty concentrating or making decisions? N  Walking or climbing stairs? N  Dressing or bathing? N  Doing errands, shopping? N  Preparing Food and eating ? N  Using the Toilet? N  In the past six months, have you accidently leaked urine? N  Do you have problems with loss of bowel control? N  Managing your Medications? N  Managing your Finances? N  Housekeeping or managing your Housekeeping? N  Some recent data might be hidden    Patient Care Team: Ria Bush, MD as PCP - General (Family Medicine)    Assessment:   This is a routine wellness examination for Jurni.   Hearing Screening   125Hz  250Hz  500Hz  1000Hz  2000Hz  3000Hz  4000Hz  6000Hz  8000Hz   Right ear:   40 40 40  40    Left ear:   40 40 40  40    Vision Screening Comments: Vision exam in July 2019 with Dr. Kerin Ransom   Exercise Activities and Dietary recommendations Current Exercise Habits: Home exercise routine;Structured exercise class(Silver Sneakers, line dance, walking), Type of exercise: strength training/weights;walking, Time (Minutes): > 60(4 hours), Frequency (Times/Week): 3, Weekly Exercise (Minutes/Week): 0, Intensity: Moderate, Exercise limited by: None identified  Goals    . Increase physical activity     Starting 12/17/2017, I will continue to exercise for at least 4 hours 3 days per week.        Fall Risk Fall Risk   12/17/2017 11/15/2016 10/07/2016 06/30/2015 01/12/2014  Falls in the past year? No No No No No  Comment - - Emmi Telephone Survey: data to providers prior to load - -   Depression Screen PHQ 2/9 Scores 12/17/2017 11/15/2016 06/30/2015 01/12/2014  PHQ - 2 Score  0 0 0 0  PHQ- 9 Score 0 2 - -     Cognitive Function MMSE - Mini Mental State Exam 12/17/2017 11/15/2016 06/30/2015  Orientation to time 5 5 5   Orientation to Place 5 5 5   Registration 3 3 3   Attention/ Calculation 0 0 0  Recall 3 3 3   Language- name 2 objects 0 0 0  Language- repeat 1 1 1   Language- follow 3 step command 3 3 3   Language- read & follow direction 0 0 0  Write a sentence 0 0 0  Copy design 0 0 0  Total score 20 20 20    PLEASE NOTE: A Mini-Cog screen was completed. Maximum score is 20. A value of 0 denotes this part of Folstein MMSE was not completed or the patient failed this part of the Mini-Cog screening.   Mini-Cog Screening Orientation to Time - Max 5 pts Orientation to Place - Max 5 pts Registration - Max 3 pts Recall - Max 3 pts Language Repeat - Max 1 pts Language Follow 3 Step Command - Max 3 pts   Immunization History  Administered Date(s) Administered  . Influenza,inj,Quad PF,6+ Mos 01/08/2013, 02/06/2016, 12/17/2017  . Influenza-Unspecified 12/01/2013  . Pneumococcal Conjugate-13 01/12/2014  . Pneumococcal Polysaccharide-23 01/08/2013    Screening Tests Health Maintenance  Topic Date Due  . MAMMOGRAM  03/11/2019 (Originally 08/10/2017)  . Fecal DNA (Cologuard)  11/16/2019 (Originally 12/04/1995)  . DTaP/Tdap/Td (1 - Tdap) 06/30/2023 (Originally 12/03/1964)  . TETANUS/TDAP  06/30/2023 (Originally 12/03/1964)  . INFLUENZA VACCINE  Completed  . DEXA SCAN  Completed  . Hepatitis C Screening  Completed  . PNA vac Low Risk Adult  Completed      Plan:   I have personally reviewed, addressed, and noted the following in the patient's chart:  A. Medical and social history B. Use of alcohol, tobacco or  illicit drugs  C. Current medications and supplements D. Functional ability and status E.  Nutritional status F.  Physical activity G. Advance directives H. List of other physicians I.  Hospitalizations, surgeries, and ER visits in previous 12 months J.  [place] to include hearing, vision, cognitive, depression L. Referrals and appointments - none  In addition, I have reviewed and discussed with patient certain preventive protocols, quality metrics, and best practice recommendations. A written personalized care plan for preventive services as well as general preventive health recommendations were provided to patient.  See attached scanned questionnaire for additional information.   Signed,   Lindell Noe, MHA, BS, LPN Health Coach

## 2017-12-18 ENCOUNTER — Encounter: Payer: Self-pay | Admitting: Family Medicine

## 2017-12-18 NOTE — Progress Notes (Signed)
BP 122/68   Pulse 73   Temp 97.7 F (36.5 C) (Oral)   Ht 5\' 2"  (1.575 m)   Wt 167 lb 8 oz (76 kg)   SpO2 97%   BMI 30.64 kg/m    CC: AMW f/u visit Subjective:    Patient ID: Valerie Paul, female    DOB: Sep 08, 1945, 72 y.o.   MRN: [id number]  HPI: Valerie Paul is a 72 y.o. female presenting on 12/19/2017 for Annual Exam   Saw Katha Cabal this week for medicare wellness visit. Note reviewed.    HLD - tolerating atorvastatin without myaglias.  Preventative: Colon cancer - overdue for screening. Reviewed options - requests iFOB Well woman exam - normal pap smear 07/2015. Could age out - pt will consider.  Mammogram - [facility] 1 08/2015. Breast exams at home. Overdue for f/u - she will call and schedule. DEXA 08/2015 WNL Flu shot yearly Pneumovax - 2014, prevnar 2015 shingrix - discussed.  Advanced directives - packet provided last year. Would want husband to be HCPOA. Still working on this.  Seat belt use discussed. Sunscreen use discussed. No changing moles on skin.  Non smoker. Second hand smoke exposure.  Alcohol - rare Dentist yearly Eye exam yearly  G0P0  Lives with husband, no pets  Occupation: retired, was Engineer, mining  Edu: BS business administration  Activity: exercise class 3x/wk (silver sneakers), walking  Diet: good water, some fruits/vegetables   Relevant past medical, surgical, family and social history reviewed and updated as indicated. Interim medical history since our last visit reviewed. Allergies and medications reviewed and updated. Outpatient Medications Prior to Visit  Medication Sig Dispense Refill  . atorvastatin (LIPITOR) 20 MG tablet TAKE 1 TABLET BY MOUTH EVERY DAY 90 tablet 0  . cholecalciferol (VITAMIN D) 1000 UNITS tablet Take 1,000 Units by mouth daily.    Marland Kitchen GARLIC PO Take by mouth daily.    . Multiple Vitamin (MULTIVITAMIN) tablet Take 1 tablet by mouth daily.    . vitamin C (ASCORBIC ACID) 500 MG tablet Take 500 mg by mouth daily.      Marland Kitchen aspirin 81 MG tablet Take 81 mg by mouth daily.     No facility-administered medications prior to visit.      Per HPI unless specifically indicated in ROS section below Review of Systems     Objective:    BP 122/68   Pulse 73   Temp 97.7 F (36.5 C) (Oral)   Ht 5\' 2"  (1.575 m)   Wt 167 lb 8 oz (76 kg)   SpO2 97%   BMI 30.64 kg/m   Wt Readings from Last 3 Encounters:  12/19/17 167 lb 8 oz (76 kg)  12/17/17 166 lb 12 oz (75.6 kg)  11/18/16 168 lb (76.2 kg)    Physical Exam  Constitutional: She is oriented to person, place, and time. She appears well-developed and well-nourished. No distress.  HENT:  Head: Normocephalic and atraumatic.  Right Ear: Hearing, tympanic membrane, external ear and ear canal normal.  Left Ear: Hearing, tympanic membrane, external ear and ear canal normal.  Nose: Nose normal.  Mouth/Throat: Uvula is midline, oropharynx is clear and moist and mucous membranes are normal. No oropharyngeal exudate, posterior oropharyngeal edema or posterior oropharyngeal erythema.  Eyes: Pupils are equal, round, and reactive to light. Conjunctivae and EOM are normal. No scleral icterus.  Neck: Normal range of motion. Neck supple. Carotid bruit is not present. No thyromegaly present.  Cardiovascular: Normal rate, regular rhythm, normal heart sounds  and intact distal pulses.  No murmur heard. Pulses:      Radial pulses are 2+ on the right side, and 2+ on the left side.  Pulmonary/Chest: Effort normal and breath sounds normal. No respiratory distress. She has no wheezes. She has no rales.  Abdominal: Soft. Bowel sounds are normal. She exhibits no distension and no mass. There is no tenderness. There is no rebound and no guarding.  Musculoskeletal: Normal range of motion. She exhibits no edema.  Lymphadenopathy:    She has no cervical adenopathy.  Neurological: She is alert and oriented to person, place, and time.  CN grossly intact, station and gait intact  Skin:  Skin is warm and dry. No rash noted.  Psychiatric: She has a normal mood and affect. Her behavior is normal. Judgment and thought content normal.  Nursing note and vitals reviewed.  Results for orders placed or performed in visit on 12/17/17  TSH  Result Value Ref Range   TSH 1.50 0.35 - 4.50 uIU/mL  Comprehensive metabolic panel  Result Value Ref Range   Sodium 141 135 - 145 mEq/L   Potassium 4.1 3.5 - 5.1 mEq/L   Chloride 104 96 - 112 mEq/L   CO2 32 19 - 32 mEq/L   Glucose, Bld 82 70 - 99 mg/dL   BUN 11 6 - 23 mg/dL   Creatinine, Ser 0.97 0.40 - 1.20 mg/dL   Total Bilirubin 0.4 0.2 - 1.2 mg/dL   Alkaline Phosphatase 74 39 - 117 U/L   AST 17 0 - 37 U/L   ALT 16 0 - 35 U/L   Total Protein 7.9 6.0 - 8.3 g/dL   Albumin 4.5 3.5 - 5.2 g/dL   Calcium 9.8 8.4 - 10.5 mg/dL   GFR 72.59 >60.00 mL/min  Lipid panel  Result Value Ref Range   Cholesterol 150 0 - 200 mg/dL   Triglycerides 132.0 0.0 - 149.0 mg/dL   HDL 44.10 >39.00 mg/dL   VLDL 26.4 0.0 - 40.0 mg/dL   LDL Cholesterol 80 0 - 99 mg/dL   Total CHOL/HDL Ratio 3    NonHDL 105.93       Assessment & Plan:  Discussed aspirin use - will decrease to 1-2/wk Problem List Items Addressed This Visit    HLD (hyperlipidemia) - Primary    Chronic, stable. Continue current regimen.  The 10-year ASCVD risk score [name], et al., 2013) is: 9.2%   Values used to calculate the score:     Age: 70 years     Sex: Female     Is Non-Hispanic African American: Yes     Diabetic: No     Tobacco smoker: No     Systolic Blood Pressure: 315 mmHg     Is BP treated: No     HDL Cholesterol: 44.1 mg/dL     Total Cholesterol: 150 mg/dL       Relevant Medications   aspirin 81 MG tablet (Start on 12/22/2017)   Advanced care planning/counseling discussion    Advanced directives - packet provided last year. Would want husband to be HCPOA. still working on this.       Abdominal aortic atherosclerosis (HCC)    Continue statin.        Relevant Medications   aspirin 81 MG tablet (Start on 12/22/2017)    Other Visit Diagnoses    Special screening for malignant neoplasms, colon       Relevant Orders   Fecal occult blood, imunochemical  Meds ordered this encounter  Medications  . aspirin 81 MG tablet    Sig: Take 1 tablet (81 mg total) by mouth 2 (two) times a week.    Dispense:  30 tablet   Orders Placed This Encounter  Procedures  . Fecal occult blood, imunochemical    Standing Status:   Future    Standing Expiration Date:   12/20/2018    Follow up plan: Return in about 1 year (around 12/20/2018) for medicare wellness visit, follow up visit.  Ria Bush, MD

## 2017-12-19 ENCOUNTER — Ambulatory Visit (INDEPENDENT_AMBULATORY_CARE_PROVIDER_SITE_OTHER): Payer: Medicare Other | Admitting: Family Medicine

## 2017-12-19 ENCOUNTER — Encounter: Payer: Self-pay | Admitting: Family Medicine

## 2017-12-19 VITALS — BP 122/68 | HR 73 | Temp 97.7°F | Ht 62.0 in | Wt 167.5 lb

## 2017-12-19 DIAGNOSIS — Z7189 Other specified counseling: Secondary | ICD-10-CM

## 2017-12-19 DIAGNOSIS — I700 Atherosclerosis of aorta: Secondary | ICD-10-CM

## 2017-12-19 DIAGNOSIS — E785 Hyperlipidemia, unspecified: Secondary | ICD-10-CM | POA: Diagnosis not present

## 2017-12-19 DIAGNOSIS — Z1211 Encounter for screening for malignant neoplasm of colon: Secondary | ICD-10-CM | POA: Diagnosis not present

## 2017-12-19 MED ORDER — ASPIRIN 81 MG PO TABS: 81.0000 mg | ORAL_TABLET | ORAL | Status: DC

## 2017-12-19 NOTE — Patient Instructions (Addendum)
Call and schedule mammogram at your convenience Pass by lab to pick up stool kit. If interested, check with pharmacy about new 2 shot shingles series (shingrix).  Decrease aspirin to 1-2 times a week.  You are doing well today Return as needed or in 1 year for next wellness visit

## 2017-12-19 NOTE — Assessment & Plan Note (Signed)
Continue statin. 

## 2017-12-19 NOTE — Assessment & Plan Note (Signed)
Chronic, stable. Continue current regimen.  The 10-year ASCVD risk score [name], et al., 2013) is: 9.2%   Values used to calculate the score:     Age: 72 years     Sex: Female     Is Non-Hispanic African American: Yes     Diabetic: No     Tobacco smoker: No     Systolic Blood Pressure: 859 mmHg     Is BP treated: No     HDL Cholesterol: 44.1 mg/dL     Total Cholesterol: 150 mg/dL

## 2017-12-19 NOTE — Assessment & Plan Note (Signed)
Advanced directives - packet provided last year. Would want husband to be HCPOA. still working on this.

## 2017-12-24 NOTE — Progress Notes (Signed)
I reviewed health advisor's note, was available for consultation, and agree with documentation and plan.  

## 2018-01-12 ENCOUNTER — Other Ambulatory Visit: Payer: Self-pay | Admitting: Family Medicine

## 2018-01-12 ENCOUNTER — Other Ambulatory Visit (INDEPENDENT_AMBULATORY_CARE_PROVIDER_SITE_OTHER): Payer: Medicare Other

## 2018-01-12 DIAGNOSIS — R195 Other fecal abnormalities: Secondary | ICD-10-CM

## 2018-01-12 DIAGNOSIS — Z1211 Encounter for screening for malignant neoplasm of colon: Secondary | ICD-10-CM

## 2018-01-12 LAB — FECAL OCCULT BLOOD, IMMUNOCHEMICAL: FECAL OCCULT BLD: POSITIVE — AB

## 2018-01-15 ENCOUNTER — Encounter: Payer: Self-pay | Admitting: Gastroenterology

## 2018-02-08 HISTORY — PX: COLONOSCOPY: SHX174

## 2018-02-12 ENCOUNTER — Ambulatory Visit (AMBULATORY_SURGERY_CENTER): Payer: Self-pay

## 2018-02-12 VITALS — Ht 62.0 in | Wt 166.8 lb

## 2018-02-12 DIAGNOSIS — R195 Other fecal abnormalities: Secondary | ICD-10-CM

## 2018-02-12 MED ORDER — NA SULFATE-K SULFATE-MG SULF 17.5-3.13-1.6 GM/177ML PO SOLN: 1.0000 | Freq: Once | ORAL | 0 refills | Status: AC

## 2018-02-12 NOTE — Progress Notes (Signed)
Denies allergies to eggs or soy products. Denies complication of anesthesia or sedation. Denies use of weight loss medication. Denies use of O2.   Emmi instructions declined.  

## 2018-02-18 ENCOUNTER — Other Ambulatory Visit: Payer: Self-pay | Admitting: Family Medicine

## 2018-02-24 ENCOUNTER — Encounter: Payer: Medicare Other | Admitting: Gastroenterology

## 2018-02-26 ENCOUNTER — Ambulatory Visit (AMBULATORY_SURGERY_CENTER): Payer: Medicare Other | Admitting: Gastroenterology

## 2018-02-26 ENCOUNTER — Encounter: Payer: Self-pay | Admitting: Gastroenterology

## 2018-02-26 VITALS — BP 125/69 | HR 79 | Temp 96.9°F | Resp 11 | Ht 62.0 in | Wt 167.0 lb

## 2018-02-26 DIAGNOSIS — K621 Rectal polyp: Secondary | ICD-10-CM | POA: Diagnosis not present

## 2018-02-26 DIAGNOSIS — D128 Benign neoplasm of rectum: Secondary | ICD-10-CM

## 2018-02-26 DIAGNOSIS — Z1211 Encounter for screening for malignant neoplasm of colon: Secondary | ICD-10-CM

## 2018-02-26 DIAGNOSIS — R195 Other fecal abnormalities: Secondary | ICD-10-CM

## 2018-02-26 MED ORDER — SODIUM CHLORIDE 0.9 % IV SOLN: 500.0000 mL | Freq: Once | INTRAVENOUS | Status: DC

## 2018-02-26 NOTE — Patient Instructions (Signed)
YOU HAD AN ENDOSCOPIC PROCEDURE TODAY AT THE [facility]:   Refer to the procedure report that was given to you for any specific questions about what was found during the examination.  If the procedure report does not answer your questions, please call your gastroenterologist to clarify.  If you requested that your care partner not be given the details of your procedure findings, then the procedure report has been included in a sealed envelope for you to review at your convenience later.  YOU SHOULD EXPECT: Some feelings of bloating in the abdomen. Passage of more gas than usual.  Walking can help get rid of the air that was put into your GI tract during the procedure and reduce the bloating. If you had a lower endoscopy (such as a colonoscopy or flexible sigmoidoscopy) you may notice spotting of blood in your stool or on the toilet paper. If you underwent a bowel prep for your procedure, you may not have a normal bowel movement for a few days.  Please Note:  You might notice some irritation and congestion in your nose or some drainage.  This is from the oxygen used during your procedure.  There is no need for concern and it should clear up in a day or so.  SYMPTOMS TO REPORT IMMEDIATELY:   Following lower endoscopy (colonoscopy or flexible sigmoidoscopy):  Excessive amounts of blood in the stool  Significant tenderness or worsening of abdominal pains  Swelling of the abdomen that is new, acute  Fever of 100F or higher   Following upper endoscopy (EGD)  Vomiting of blood or coffee ground material  New chest pain or pain under the shoulder blades  Painful or persistently difficult swallowing  New shortness of breath  Fever of 100F or higher  Black, tarry-looking stools  For urgent or emergent issues, a gastroenterologist can be reached at any hour by calling [phone].   DIET:  We do recommend a small meal at first, but then you may proceed to your regular diet.  Drink  plenty of fluids but you should avoid alcoholic beverages for 24 hours.  ACTIVITY:  You should plan to take it easy for the rest of today and you should NOT DRIVE or use heavy machinery until tomorrow (because of the sedation medicines used during the test).    FOLLOW UP: Our staff will call the number listed on your records the next business day following your procedure to check on you and address any questions or concerns that you may have regarding the information given to you following your procedure. If we do not reach you, we will leave a message.  However, if you are feeling well and you are not experiencing any problems, there is no need to return our call.  We will assume that you have returned to your regular daily activities without incident.  If any biopsies were taken you will be contacted by phone or by letter within the next 1-3 weeks.  Please call us at [phone] if you have not heard about the biopsies in 3 weeks.    SIGNATURES/CONFIDENTIALITY: You and/or your care partner have signed paperwork which will be entered into your electronic medical record.  These signatures attest to the fact that that the information above on your After Visit Summary has been reviewed and is understood.  Full responsibility of the confidentiality of this discharge information lies with you and/or your care-partner. 

## 2018-02-26 NOTE — Op Note (Signed)
[facility] Patient Name: Valerie Paul Procedure Date: 02/26/2018 9:11 AM MRN: [id number] Endoscopist: Mauri Pole , MD Age: 72 Referring MD:  Date of Birth: [birth year] Gender: Female Account #: [id number] Procedure:                Colonoscopy Indications:              Screening for colorectal malignant neoplasm, This                            is the patient's first colonoscopy Medicines:                Monitored Anesthesia Care Procedure:                Pre-Anesthesia Assessment:                           - Prior to the procedure, a History and Physical                            was performed, and patient medications and                            allergies were reviewed. The patient's tolerance of                            previous anesthesia was also reviewed. The risks                            and benefits of the procedure and the sedation                            options and risks were discussed with the patient.                            All questions were answered, and informed consent                            was obtained. Prior Anticoagulants: The patient has                            taken no previous anticoagulant or antiplatelet                            agents. ASA Grade Assessment: II - A patient with                            mild systemic disease. After reviewing the risks                            and benefits, the patient was deemed in                            satisfactory condition to undergo the procedure.  After obtaining informed consent, the colonoscope                            was passed under direct vision. Throughout the                            procedure, the patient's blood pressure, pulse, and                            oxygen saturations were monitored continuously. The                            Model PCF-H190DL [id number]) scope was introduced                            through the anus and  advanced to the the cecum,                            identified by appendiceal orifice and ileocecal                            valve. The colonoscopy was performed without                            difficulty. The patient tolerated the procedure                            well. The quality of the bowel preparation was                            excellent. The ileocecal valve, appendiceal                            orifice, and rectum were photographed. Scope In: 9:19:05 AM Scope Out: 9:31:25 AM Scope Withdrawal Time: 0 hours 9 minutes 38 seconds  Total Procedure Duration: 0 hours 12 minutes 20 seconds  Findings:                 The perianal and digital rectal examinations were                            normal.                           Four hyperplastic appearing polyps were found in                            the rectum. The polyps were 1 to 3 mm in size.                            These polyps were removed with a cold biopsy                            forceps. Resection and retrieval were complete.  Non-bleeding internal hemorrhoids were found during                            retroflexion. The hemorrhoids were small. Complications:            No immediate complications. Estimated Blood Loss:     Estimated blood loss was minimal. Impression:               - Four 1 to 3 mm polyps in the rectum, removed with                            a cold biopsy forceps. Resected and retrieved.                           - Non-bleeding internal hemorrhoids. Recommendation:           - Patient has a contact number available for                            emergencies. The signs and symptoms of potential                            delayed complications were discussed with the                            patient. Return to normal activities tomorrow.                            Written discharge instructions were provided to the                            patient.                            - Resume previous diet.                           - Continue present medications.                           - Await pathology results.                           - Repeat colonoscopy in 5-10 years for surveillance                            based on pathology results. Mauri Pole, MD 02/26/2018 9:37:21 AM This report has been signed electronically.

## 2018-02-26 NOTE — Progress Notes (Signed)
Called to room to assist during endoscopic procedure.  Patient ID and intended procedure confirmed with present staff. Received instructions for my participation in the procedure from the performing physician.  

## 2018-02-26 NOTE — Progress Notes (Signed)
To PACU, VSS. Report to [email or web address] 

## 2018-02-26 NOTE — Progress Notes (Signed)
Pt's states no medical or surgical changes since previsit or office visit. 

## 2018-02-27 ENCOUNTER — Telehealth: Payer: Self-pay | Admitting: *Deleted

## 2018-02-27 NOTE — Telephone Encounter (Signed)
  Follow up Call-  Call back number 02/26/2018  Post procedure Call Back phone  # [phone]  Permission to leave phone message Yes  Some recent data might be hidden     Patient questions:  Do you have a fever, pain , or abdominal swelling? No. Pain Score  0 *  Have you tolerated food without any problems? Yes.    Have you been able to return to your normal activities? Yes.    Do you have any questions about your discharge instructions: Diet   No. Medications  No. Follow up visit  No.  Do you have questions or concerns about your Care? No.  Actions: * If pain score is 4 or above: No action needed, pain <4.

## 2018-03-09 ENCOUNTER — Encounter: Payer: Self-pay | Admitting: Gastroenterology

## 2018-03-09 ENCOUNTER — Telehealth: Payer: Self-pay | Admitting: Gastroenterology

## 2018-03-09 NOTE — Telephone Encounter (Signed)
Pt had colon 12/19 w Dr Silverio Decamp. Pt would like a CB to discuss results.

## 2018-03-10 NOTE — Telephone Encounter (Signed)
Left message to call back. Result letter was mailed yesterday.

## 2018-03-10 NOTE — Telephone Encounter (Signed)
Patient returned your call, please call patient one more time.   

## 2018-03-12 ENCOUNTER — Encounter: Payer: Self-pay | Admitting: Family Medicine

## 2018-03-16 NOTE — Telephone Encounter (Signed)
Called and spoke with patient-patient informed of results per letter from MD; Patient verbalized understanding of information/instructions; Patient was advised to call back if questions/concerns arise;

## 2018-04-29 ENCOUNTER — Encounter: Payer: Self-pay | Admitting: Family Medicine

## 2018-11-23 DIAGNOSIS — H04123 Dry eye syndrome of bilateral lacrimal glands: Secondary | ICD-10-CM | POA: Diagnosis not present

## 2018-11-23 DIAGNOSIS — H25813 Combined forms of age-related cataract, bilateral: Secondary | ICD-10-CM | POA: Diagnosis not present

## 2018-11-23 DIAGNOSIS — H524 Presbyopia: Secondary | ICD-10-CM | POA: Diagnosis not present

## 2018-12-22 ENCOUNTER — Ambulatory Visit: Payer: Medicare Other

## 2018-12-22 ENCOUNTER — Other Ambulatory Visit (INDEPENDENT_AMBULATORY_CARE_PROVIDER_SITE_OTHER): Payer: Medicare Other

## 2018-12-22 ENCOUNTER — Ambulatory Visit (INDEPENDENT_AMBULATORY_CARE_PROVIDER_SITE_OTHER): Payer: Medicare Other

## 2018-12-22 ENCOUNTER — Other Ambulatory Visit: Payer: Self-pay | Admitting: Family Medicine

## 2018-12-22 DIAGNOSIS — E049 Nontoxic goiter, unspecified: Secondary | ICD-10-CM | POA: Diagnosis not present

## 2018-12-22 DIAGNOSIS — E785 Hyperlipidemia, unspecified: Secondary | ICD-10-CM

## 2018-12-22 DIAGNOSIS — Z0000 Encounter for general adult medical examination without abnormal findings: Secondary | ICD-10-CM

## 2018-12-22 LAB — COMPREHENSIVE METABOLIC PANEL
ALT: 17 U/L (ref 0–35)
AST: 16 U/L (ref 0–37)
Albumin: 4.3 g/dL (ref 3.5–5.2)
Alkaline Phosphatase: 62 U/L (ref 39–117)
BUN: 12 mg/dL (ref 6–23)
CO2: 30 mEq/L (ref 19–32)
Calcium: 9.5 mg/dL (ref 8.4–10.5)
Chloride: 106 mEq/L (ref 96–112)
Creatinine, Ser: 0.93 mg/dL (ref 0.40–1.20)
GFR: 71.5 mL/min (ref >60.00)
Glucose, Bld: 92 mg/dL (ref 70–99)
Potassium: 4.4 mEq/L (ref 3.5–5.1)
Sodium: 143 mEq/L (ref 135–145)
Total Bilirubin: 0.5 mg/dL (ref 0.2–1.2)
Total Protein: 6.9 g/dL (ref 6.0–8.3)

## 2018-12-22 LAB — TSH: TSH: 2.05 u[IU]/mL (ref 0.35–4.50)

## 2018-12-22 LAB — LIPID PANEL
Cholesterol: 146 mg/dL (ref 0–200)
HDL: 42.3 mg/dL (ref >39.00)
LDL Cholesterol: 82 mg/dL (ref 0–99)
NonHDL: 103.55
Total CHOL/HDL Ratio: 3
Triglycerides: 108 mg/dL (ref 0.0–149.0)
VLDL: 21.6 mg/dL (ref 0.0–40.0)

## 2018-12-22 NOTE — Progress Notes (Signed)
PCP notes: none  Health Maintenance: Patient will get flu vaccine next week at office visit. Patient declined mammogram, Tdap and Shingrix at this time.     Abnormal Screenings: none     Patient concerns:none    Nurse concerns: none    Next PCP appt.: 12/29/2018 @ 10:30 am

## 2018-12-22 NOTE — Progress Notes (Signed)
Subjective:   Valerie Paul is a 73 y.o. female who presents for Medicare Annual (Subsequent) preventive examination.  Review of Systems:    This visit is being conducted through telemedicine via telephone at the nurse health advisor's home address due to the COVID-19 pandemic. This patient has given me verbal consent via doximity to conduct this visit, patient states they are participating from their home address. Some vital signs may be absent or patient reported.    Patient identification: identified by name, DOB, and current address  Cardiac Risk Factors include: advanced age (>60men, >48 women);dyslipidemia     Objective:     Vitals: There were no vitals taken for this visit.  There is no height or weight on file to calculate BMI.  Advanced Directives 12/22/2018 12/17/2017 11/15/2016 01/20/2016 06/30/2015  Does Patient Have a Medical Advance Directive? No No No No No  Would patient like information on creating a medical advance directive? No - Patient declined No - Patient declined - No - patient declined information No - patient declined information    Tobacco Social History   Tobacco Use  Smoking Status Former Smoker  . Packs/day: 1.00  . Start date: 03/12/1963  . Quit date: 03/12/2003  . Years since quitting: 15.7  Smokeless Tobacco Never Used     Counseling given: Not Answered   Clinical Intake:  Pre-visit preparation completed: Yes  Pain : No/denies pain     Nutritional Risks: None Diabetes: No  How often do you need to have someone help you when you read instructions, pamphlets, or other written materials from your doctor or pharmacy?: 1 - Never What is the last grade level you completed in school?: BS degree  Interpreter Needed?: No  Information entered by :: CJohnson, LPN  Past Medical History:  Diagnosis Date  . Allergy   . Anemia   . Arthritis   . Blood transfusion without reported diagnosis   . 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  History of anemia   . HLD (hyperlipidemia)   . Infertility 1970s   tube blockage, G0P0  . Seasonal allergies    pollen   Past Surgical History:  Procedure Laterality Date  . APPENDECTOMY  1977  . BIOPSY THYROID  2006   normal per pt ([facility])  . COLONOSCOPY  02/2018   4 HP polyps, consider rpt 10 yrs ([facility])  . DEXA  08/2015   WNL  . DILATION AND CURETTAGE OF UTERUS    . LAPAROSCOPY  1982  . MYOMECTOMY  1977   fibroids  . OVARIAN CYST REMOVAL     right   Family History  Problem Relation Age of Onset  . Cancer Brother 54       lung (smoker)  . Cancer Mother 51       brain  . Heart failure Father   . Hypertension Maternal Aunt   . Learning disabilities Daughter   . CAD Neg Hx   . Stroke Neg Hx   . Diabetes Neg Hx   . Colon cancer Neg Hx   . Esophageal cancer Neg Hx   . Stomach cancer Neg Hx   . Rectal cancer Neg Hx    Social History   Socioeconomic History  . Marital status: Married    Spouse name: Not on file  . Number of children: Not on file  . Years of education: Not on file  . Highest education level: Not on file  Occupational History  .  Not on file  Social Needs  . Financial resource strain: Not hard at all  . Food insecurity    Worry: Never true    Inability: Never true  . Transportation needs    Medical: No    Non-medical: No  Tobacco Use  . Smoking status: Former Smoker    Packs/day: 1.00    Start date: 03/12/1963    Quit date: 03/12/2003    Years since quitting: 15.7  . Smokeless tobacco: Never Used  Substance and Sexual Activity  . Alcohol use: Yes    Comment: Rare  . Drug use: No  . Sexual activity: Never  Lifestyle  . Physical activity    Days per week: 0 days    Minutes per session: 0 min  . Stress: Not at all  Relationships  . Social Herbalist on phone: Not on file    Gets together: Not on file    Attends religious service: Not on file    Active member of club or organization: Not on file    Attends meetings of clubs  or organizations: Not on file    Relationship status: Not on file  Other Topics Concern  . Not on file  Social History Narrative   Lives with husband, no pets   Occupation: retired, was Engineer, mining   Edu: BS business administration   Activity: exercise class 3x/wk, walking    Diet: good water, some fruits/vegetables    Outpatient Encounter Medications as of 12/22/2018  Medication Sig  . aspirin 81 MG tablet Take 1 tablet (81 mg total) by mouth 2 (two) times a week.  Marland Kitchen atorvastatin (LIPITOR) 20 MG tablet TAKE 1 TABLET BY MOUTH EVERY DAY  . cholecalciferol (VITAMIN D) 1000 UNITS tablet Take 1,000 Units by mouth daily.  Marland Kitchen GARLIC PO Take by mouth daily.  . Multiple Vitamin (MULTIVITAMIN) tablet Take 1 tablet by mouth daily.  . vitamin C (ASCORBIC ACID) 500 MG tablet Take 500 mg by mouth daily.   No facility-administered encounter medications on file as of 12/22/2018.     Activities of Daily Living In your present state of health, do you have any difficulty performing the following activities: 12/22/2018  Hearing? N  Vision? N  Difficulty concentrating or making decisions? N  Walking or climbing stairs? N  Dressing or bathing? N  Doing errands, shopping? N  Preparing Food and eating ? N  Using the Toilet? N  In the past six months, have you accidently leaked urine? N  Do you have problems with loss of bowel control? N  Managing your Medications? N  Managing your Finances? N  Housekeeping or managing your Housekeeping? N  Some recent data might be hidden    Patient Care Team: Ria Bush, MD as PCP - General (Family Medicine)    Assessment:   This is a routine wellness examination for Valerie Paul.  Exercise Activities and Dietary recommendations Current Exercise Habits: The patient does not participate in regular exercise at present, Exercise limited by: None identified  Goals    . Increase physical activity     Starting 12/17/2017, I will continue to exercise for  at least 4 hours 3 days per week.     . Patient Stated     12/22/2018, I will start back trying to exercise daily.       Fall Risk Fall Risk  12/22/2018 12/17/2017 11/15/2016 10/07/2016 06/30/2015  Falls in the past year? 0 No No No No  Comment - - -  Emmi Telephone Survey: data to providers prior to load -  Risk for fall due to : Medication side effect - - - -  Follow up Falls evaluation completed;Falls prevention discussed - - - -   Is the patient's home free of loose throw rugs in walkways, pet beds, electrical cords, etc?   yes      Grab bars in the bathroom? no      Handrails on the stairs?   yes      Adequate lighting?   yes  Timed Get Up and Go performed: N/A  Depression Screen PHQ 2/9 Scores 12/22/2018 12/17/2017 11/15/2016 06/30/2015  PHQ - 2 Score 0 0 0 0  PHQ- 9 Score 0 0 2 -     Cognitive Function MMSE - Mini Mental State Exam 12/22/2018 12/17/2017 11/15/2016 06/30/2015  Orientation to time 5 5 5 5   Orientation to Place 5 5 5 5   Registration 3 3 3 3   Attention/ Calculation 5 0 0 0  Recall 3 3 3 3   Language- name 2 objects - 0 0 0  Language- repeat 1 1 1 1   Language- follow 3 step command - 3 3 3   Language- read & follow direction - 0 0 0  Write a sentence - 0 0 0  Copy design - 0 0 0  Total score - 20 20 20   Mini Cog  Mini-Cog screen was completed. Maximum score is 22. A value of 0 denotes this part of the MMSE was not completed or the patient failed this part of the Mini-Cog screening.      Immunization History  Administered Date(s) Administered  . Influenza,inj,Quad PF,6+ Mos 01/08/2013, 02/06/2016, 12/17/2017  . Influenza-Unspecified 12/01/2013  . Pneumococcal Conjugate-13 01/12/2014  . Pneumococcal Polysaccharide-23 01/08/2013    Qualifies for Shingles Vaccine? yes  Screening Tests Health Maintenance  Topic Date Due  . INFLUENZA VACCINE  10/10/2018  . MAMMOGRAM  03/11/2019 (Originally 08/10/2017)  . DTaP/Tdap/Td (1 - Tdap) 06/30/2023 (Originally  12/03/1964)  . TETANUS/TDAP  06/30/2023 (Originally [birth year]03/1964)  . COLONOSCOPY  02/27/2028  . DEXA SCAN  Completed  . Hepatitis C Screening  Completed  . PNA vac Low Risk Adult  Completed    Cancer Screenings: Lung: Low Dose CT Chest recommended if Age 65-80 years, 30 pack-year currently smoking OR have quit w/in 15years. Patient does not qualify. Breast:  Up to date on Mammogram? No, declined at this time  Up to date of Bone Density/Dexa? Yes, completed 08/10/2015 Colorectal: completed 02/26/2018  Additional Screenings:  Hepatitis C Screening: 06/30/2015     Plan:    Patient wants to work on increasing her exercise daily.    I have personally reviewed and noted the following in the patient's chart:   . Medical and social history . Use of alcohol, tobacco or illicit drugs  . Current medications and supplements . Functional ability and status . Nutritional status . Physical activity . Advanced directives . List of other physicians . Hospitalizations, surgeries, and ER visits in previous 12 months . Vitals . Screenings to include cognitive, depression, and falls . Referrals and appointments  In addition, I have reviewed and discussed with patient certain preventive protocols, quality metrics, and best practice recommendations. A written personalized care plan for preventive services as well as general preventive health recommendations were provided to patient.     Andrez Grime, LPN  [phone]

## 2018-12-22 NOTE — Patient Instructions (Signed)
Valerie Paul , Thank you for taking time to come for your Medicare Wellness Visit. I appreciate your ongoing commitment to your health goals. Please review the following plan we discussed and let me know if I can assist you in the future.   Screening recommendations/referrals: Colonoscopy: up to date, completed 02/26/2018 Mammogram: declined Bone Density: up to date, completed 08/10/2015 Recommended yearly ophthalmology/optometry visit for glaucoma screening and checkup Recommended yearly dental visit for hygiene and checkup  Vaccinations: Influenza vaccine: will get at next office visit Pneumococcal vaccine: series completed  Tdap vaccine: declined Shingles vaccine: declined    Advanced directives: Advance directive discussed with you today. Even though you declined this today please call our office should you change your mind and we can give you the proper paperwork for you to fill out.  Conditions/risks identified: hyperlipidemia  Next appointment: 12/29/2018 @ 10:30 am    Preventive Care 65 Years and Older, Female Preventive care refers to lifestyle choices and visits with your health care provider that can promote health and wellness. What does preventive care include?  A yearly physical exam. This is also called an annual well check.  Dental exams once or twice a year.  Routine eye exams. Ask your health care provider how often you should have your eyes checked.  Personal lifestyle choices, including:  Daily care of your teeth and gums.  Regular physical activity.  Eating a healthy diet.  Avoiding tobacco and drug use.  Limiting alcohol use.  Practicing safe sex.  Taking low-dose aspirin every day.  Taking vitamin and mineral supplements as recommended by your health care provider. What happens during an annual well check? The services and screenings done by your health care provider during your annual well check will depend on your age, overall health, lifestyle  risk factors, and family history of disease. Counseling  Your health care provider may ask you questions about your:  Alcohol use.  Tobacco use.  Drug use.  Emotional well-being.  Home and relationship well-being.  Sexual activity.  Eating habits.  History of falls.  Memory and ability to understand (cognition).  Work and work Statistician.  Reproductive health. Screening  You may have the following tests or measurements:  Height, weight, and BMI.  Blood pressure.  Lipid and cholesterol levels. These may be checked every 5 years, or more frequently if you are over 43 years old.  Skin check.  Lung cancer screening. You may have this screening every year starting at age 29 if you have a 30-pack-year history of smoking and currently smoke or have quit within the past 15 years.  Fecal occult blood test (FOBT) of the stool. You may have this test every year starting at age 30.  Flexible sigmoidoscopy or colonoscopy. You may have a sigmoidoscopy every 5 years or a colonoscopy every 10 years starting at age 45.  Hepatitis C blood test.  Hepatitis B blood test.  Sexually transmitted disease (STD) testing.  Diabetes screening. This is done by checking your blood sugar (glucose) after you have not eaten for a while (fasting). You may have this done every 1-3 years.  Bone density scan. This is done to screen for osteoporosis. You may have this done starting at age 25.  Mammogram. This may be done every 1-2 years. Talk to your health care provider about how often you should have regular mammograms. Talk with your health care provider about your test results, treatment options, and if necessary, the need for more tests. Vaccines  Your  health care provider may recommend certain vaccines, such as:  Influenza vaccine. This is recommended every year.  Tetanus, diphtheria, and acellular pertussis (Tdap, Td) vaccine. You may need a Td booster every 10 years.  Zoster vaccine.  You may need this after age 72.  Pneumococcal 13-valent conjugate (PCV13) vaccine. One dose is recommended after age 31.  Pneumococcal polysaccharide (PPSV23) vaccine. One dose is recommended after age 69. Talk to your health care provider about which screenings and vaccines you need and how often you need them. This information is not intended to replace advice given to you by your health care provider. Make sure you discuss any questions you have with your health care provider. Document Released: 03/24/2015 Document Revised: 11/15/2015 Document Reviewed: 12/27/2014 Elsevier Interactive Patient Education  2017 [place] Prevention in the Home Falls can cause injuries. They can happen to people of all ages. There are many things you can do to make your home safe and to help prevent falls. What can I do on the outside of my home?  Regularly fix the edges of walkways and driveways and fix any cracks.  Remove anything that might make you trip as you walk through a door, such as a raised step or threshold.  Trim any bushes or trees on the path to your home.  Use bright outdoor lighting.  Clear any walking paths of anything that might make someone trip, such as rocks or tools.  Regularly check to see if handrails are loose or broken. Make sure that both sides of any steps have handrails.  Any raised decks and porches should have guardrails on the edges.  Have any leaves, snow, or ice cleared regularly.  Use sand or salt on walking paths during winter.  Clean up any spills in your garage right away. This includes oil or grease spills. What can I do in the bathroom?  Use night lights.  Install grab bars by the toilet and in the tub and shower. Do not use towel bars as grab bars.  Use non-skid mats or decals in the tub or shower.  If you need to sit down in the shower, use a plastic, non-slip stool.  Keep the floor dry. Clean up any water that spills on the floor as soon  as it happens.  Remove soap buildup in the tub or shower regularly.  Attach bath mats securely with double-sided non-slip rug tape.  Do not have throw rugs and other things on the floor that can make you trip. What can I do in the bedroom?  Use night lights.  Make sure that you have a light by your bed that is easy to reach.  Do not use any sheets or blankets that are too big for your bed. They should not hang down onto the floor.  Have a firm chair that has side arms. You can use this for support while you get dressed.  Do not have throw rugs and other things on the floor that can make you trip. What can I do in the kitchen?  Clean up any spills right away.  Avoid walking on wet floors.  Keep items that you use a lot in easy-to-reach places.  If you need to reach something above you, use a strong step stool that has a grab bar.  Keep electrical cords out of the way.  Do not use floor polish or wax that makes floors slippery. If you must use wax, use non-skid floor wax.  Do not  have throw rugs and other things on the floor that can make you trip. What can I do with my stairs?  Do not leave any items on the stairs.  Make sure that there are handrails on both sides of the stairs and use them. Fix handrails that are broken or loose. Make sure that handrails are as long as the stairways.  Check any carpeting to make sure that it is firmly attached to the stairs. Fix any carpet that is loose or worn.  Avoid having throw rugs at the top or bottom of the stairs. If you do have throw rugs, attach them to the floor with carpet tape.  Make sure that you have a light switch at the top of the stairs and the bottom of the stairs. If you do not have them, ask someone to add them for you. What else can I do to help prevent falls?  Wear shoes that:  Do not have high heels.  Have rubber bottoms.  Are comfortable and fit you well.  Are closed at the toe. Do not wear sandals.  If  you use a stepladder:  Make sure that it is fully opened. Do not climb a closed stepladder.  Make sure that both sides of the stepladder are locked into place.  Ask someone to hold it for you, if possible.  Clearly mark and make sure that you can see:  Any grab bars or handrails.  First and last steps.  Where the edge of each step is.  Use tools that help you move around (mobility aids) if they are needed. These include:  Canes.  Walkers.  Scooters.  Crutches.  Turn on the lights when you go into a dark area. Replace any light bulbs as soon as they burn out.  Set up your furniture so you have a clear path. Avoid moving your furniture around.  If any of your floors are uneven, fix them.  If there are any pets around you, be aware of where they are.  Review your medicines with your doctor. Some medicines can make you feel dizzy. This can increase your chance of falling. Ask your doctor what other things that you can do to help prevent falls. This information is not intended to replace advice given to you by your health care provider. Make sure you discuss any questions you have with your health care provider. Document Released: 12/22/2008 Document Revised: 08/03/2015 Document Reviewed: 04/01/2014 Elsevier Interactive Patient Education  2017 [employer].

## 2018-12-29 ENCOUNTER — Ambulatory Visit (INDEPENDENT_AMBULATORY_CARE_PROVIDER_SITE_OTHER): Payer: Medicare Other | Admitting: Family Medicine

## 2018-12-29 ENCOUNTER — Encounter: Payer: Self-pay | Admitting: Family Medicine

## 2018-12-29 ENCOUNTER — Other Ambulatory Visit: Payer: Self-pay

## 2018-12-29 VITALS — BP 120/72 | HR 93 | Temp 97.6°F | Ht 61.5 in | Wt 167.1 lb

## 2018-12-29 DIAGNOSIS — Z01419 Encounter for gynecological examination (general) (routine) without abnormal findings: Secondary | ICD-10-CM | POA: Diagnosis not present

## 2018-12-29 DIAGNOSIS — Z7189 Other specified counseling: Secondary | ICD-10-CM | POA: Diagnosis not present

## 2018-12-29 DIAGNOSIS — E049 Nontoxic goiter, unspecified: Secondary | ICD-10-CM

## 2018-12-29 DIAGNOSIS — I700 Atherosclerosis of aorta: Secondary | ICD-10-CM

## 2018-12-29 DIAGNOSIS — E785 Hyperlipidemia, unspecified: Secondary | ICD-10-CM

## 2018-12-29 DIAGNOSIS — Z23 Encounter for immunization: Secondary | ICD-10-CM | POA: Diagnosis not present

## 2018-12-29 NOTE — Assessment & Plan Note (Signed)
Benign exam today. Have decided to stop cervical cancer screening (last done 2017).

## 2018-12-29 NOTE — Progress Notes (Addendum)
This visit was conducted in person.  BP 120/72 (BP Location: Left Arm, Patient Position: Sitting, Cuff Size: Normal)   Pulse 93   Temp 97.6 F (36.4 C) (Temporal)   Ht 5' 1.5" (1.562 m)   Wt 167 lb 2 oz (75.8 kg)   SpO2 96%   BMI 31.07 kg/m    CC: AMW f/u visit Subjective:    Patient ID: Valerie Paul, female    DOB: [birth year], 73 y.o.   MRN: [id number]  HPI: Valerie Paul is a 73 y.o. female presenting on 12/29/2018 for Annual Exam (Prt 2. )   Saw health advisor last week for medicare wellness visit. Note reviewed.    No exam data present    Clinical Support from 12/22/2018 in [place] at [facility] Total Score  0      Fall Risk  12/22/2018 12/17/2017 11/15/2016 10/07/2016 06/30/2015  Falls in the past year? 0 No No No No  Comment - - - Emmi Telephone Survey: data to providers prior to load -  Risk for fall due to : Medication side effect - - - -  Follow up Falls evaluation completed;Falls prevention discussed - - - -      Husband has had recent vascular issues - toe amputations over summer 2020. She has been caring for him.   Preventative: COLONOSCOPY 02/2018 - 4 HP polyps, consider rpt 10 yrs ([place]) Well woman exam - normal pap smear 07/2015. Always normal pap smears. Will age out of cervical cancer screening. No pelvic pain/pressure, vaginal bleeding, or skin changes.  Mammogram - [facility] 16/2017. Breast exams at home. Overdue for f/u - she will call and schedule.  DEXA 08/2015 WNL Flu shotyearly Pneumovax -2014, prevnar 2015 shingrix - discussed. Will check at pharmacy.  Advanced directives - packet providedlast year. Would want husband to be HCPOA.Still working on this.  Seat belt use discussed. Sunscreen use discussed. No changing moles on skin.  Non smoker. Husband quit.  Alcohol - rare  Dentist yearly  Eye exam yearly  Bowel - no constipation/diarrhea  Bladder - no incontinence  G0P0  Lives with husband, no pets  Occupation:  retired, was Engineer, mining  Edu: BS business administration  Activity: exercise class 3x/wk (silver sneakers), walking  Diet: good water, some fruits/vegetables      Relevant past medical, surgical, family and social history reviewed and updated as indicated. Interim medical history since our last visit reviewed. Allergies and medications reviewed and updated. Outpatient Medications Prior to Visit  Medication Sig Dispense Refill  . aspirin 81 MG tablet Take 1 tablet (81 mg total) by mouth 2 (two) times a week. (Patient taking differently: Take 81 mg by mouth once a week. ) 30 tablet   . atorvastatin (LIPITOR) 20 MG tablet TAKE 1 TABLET BY MOUTH EVERY DAY 90 tablet 3  . cholecalciferol (VITAMIN D) 1000 UNITS tablet Take 1,000 Units by mouth daily.    Marland Kitchen GARLIC PO Take by mouth daily.    . Multiple Vitamin (MULTIVITAMIN) tablet Take 1 tablet by mouth daily.    . vitamin C (ASCORBIC ACID) 500 MG tablet Take 500 mg by mouth daily.     No facility-administered medications prior to visit.      Per HPI unless specifically indicated in ROS section below Review of Systems Objective:    BP 120/72 (BP Location: Left Arm, Patient Position: Sitting, Cuff Size: Normal)   Pulse 93   Temp 97.6 F (36.4 C) (Temporal)   Ht  5' 1.5" (1.562 m)   Wt 167 lb 2 oz (75.8 kg)   SpO2 96%   BMI 31.07 kg/m   Wt Readings from Last 3 Encounters:  12/29/18 167 lb 2 oz (75.8 kg)  02/26/18 167 lb (75.8 kg)  02/12/18 166 lb 12.8 oz (75.7 kg)    Physical Exam Vitals signs and nursing note reviewed. Exam conducted with a chaperone present.  Constitutional:      General: She is not in acute distress.    Appearance: Normal appearance. She is well-developed. She is not ill-appearing.  HENT:     Head: Normocephalic and atraumatic.     Right Ear: Hearing, tympanic membrane, ear canal and external ear normal.     Left Ear: Hearing, tympanic membrane, ear canal and external ear normal.     Nose: Nose  normal.     Mouth/Throat:     Mouth: Mucous membranes are moist.     Pharynx: Oropharynx is clear. Uvula midline. No posterior oropharyngeal erythema.  Eyes:     General: No scleral icterus.    Extraocular Movements: Extraocular movements intact.     Conjunctiva/sclera: Conjunctivae normal.     Pupils: Pupils are equal, round, and reactive to light.  Neck:     Musculoskeletal: Normal range of motion and neck supple.     Thyroid: Thyroid mass (R nodule) present.  Cardiovascular:     Rate and Rhythm: Normal rate and regular rhythm.     Pulses: Normal pulses.          Radial pulses are 2+ on the right side and 2+ on the left side.     Heart sounds: Normal heart sounds. No murmur.  Pulmonary:     Effort: Pulmonary effort is normal. No respiratory distress.     Breath sounds: Normal breath sounds. No wheezing, rhonchi or rales.  Abdominal:     General: Abdomen is flat. Bowel sounds are normal. There is no distension.     Palpations: Abdomen is soft. There is no mass.     Tenderness: There is no abdominal tenderness. There is no guarding or rebound.     Hernia: No hernia is present.  Genitourinary:    Exam position: Supine.     Pubic Area: No rash.      Labia:        Right: No rash, tenderness, lesion or injury.        Left: No rash, tenderness, lesion or injury.      Vagina: Normal. No erythema, tenderness, bleeding, lesions or prolapsed vaginal walls.     Cervix: Normal.     Uterus: Normal.      Adnexa: Right adnexa normal and left adnexa normal.  Musculoskeletal: Normal range of motion.     Right lower leg: No edema.     Left lower leg: No edema.  Lymphadenopathy:     Cervical: No cervical adenopathy.  Skin:    General: Skin is warm and dry.     Findings: No rash.  Neurological:     General: No focal deficit present.     Mental Status: She is alert and oriented to person, place, and time.     Comments: CN grossly intact, station and gait intact  Psychiatric:        Mood  and Affect: Mood normal.        Behavior: Behavior normal.        Thought Content: Thought content normal.        Judgment: Judgment  normal.       Results for orders placed or performed in visit on 12/22/18  TSH  Result Value Ref Range   TSH 2.05 0.35 - 4.50 uIU/mL  Comprehensive metabolic panel  Result Value Ref Range   Sodium 143 135 - 145 mEq/L   Potassium 4.4 3.5 - 5.1 mEq/L   Chloride 106 96 - 112 mEq/L   CO2 30 19 - 32 mEq/L   Glucose, Bld 92 70 - 99 mg/dL   BUN 12 6 - 23 mg/dL   Creatinine, Ser 0.93 0.40 - 1.20 mg/dL   Total Bilirubin 0.5 0.2 - 1.2 mg/dL   Alkaline Phosphatase 62 39 - 117 U/L   AST 16 0 - 37 U/L   ALT 17 0 - 35 U/L   Total Protein 6.9 6.0 - 8.3 g/dL   Albumin 4.3 3.5 - 5.2 g/dL   Calcium 9.5 8.4 - 10.5 mg/dL   GFR 71.50 >60.00 mL/min  Lipid panel  Result Value Ref Range   Cholesterol 146 0 - 200 mg/dL   Triglycerides 108.0 0.0 - 149.0 mg/dL   HDL 42.30 >39.00 mg/dL   VLDL 21.6 0.0 - 40.0 mg/dL   LDL Cholesterol 82 0 - 99 mg/dL   Total CHOL/HDL Ratio 3    NonHDL 103.55    Assessment & Plan:   Problem List Items Addressed This Visit    HLD (hyperlipidemia)    Chronic, stable. Continue lipitor.  The 10-year ASCVD risk score [name], et al., 2013) is: 9.2%   Values used to calculate the score:     Age: 5 years     Sex: Female     Is Non-Hispanic African American: Yes     Diabetic: No     Tobacco smoker: No     Systolic Blood Pressure: 123456 mmHg     Is BP treated: No     HDL Cholesterol: 42.3 mg/dL     Total Cholesterol: 146 mg/dL       Goiter    Palpable R thyroid nodule today.  Declines thyroid US at this time. Declines compressive symptoms.  H/o benign thyroid biopsy 15+ yrs ago in [place].       Encounter for routine gynecological examination    Benign exam today. Have decided to stop cervical cancer screening (last done 2017).       Advanced care planning/counseling discussion - Primary    Advanced directives - packet  again today. Would want husband to be HCPOA.Still working on this.       Abdominal aortic atherosclerosis (HCC)    Continue statin, low dose aspirin.        Other Visit Diagnoses    Need for influenza vaccination       Relevant Orders   Flu Vaccine QUAD High Dose(Fluad) (Completed)       No orders of the defined types were placed in this encounter.  Orders Placed This Encounter  Procedures  . Flu Vaccine QUAD High Dose(Fluad)    Follow up plan: Return in about 1 year (around 12/29/2019) for medicare wellness visit.  Ria Bush, MD

## 2018-12-29 NOTE — Assessment & Plan Note (Signed)
Continue statin, low dose aspirin.

## 2018-12-29 NOTE — Assessment & Plan Note (Signed)
Chronic, stable. Continue lipitor.  The 10-year ASCVD risk score [name], et al., 2013) is: 9.2%   Values used to calculate the score:     Age: 73 years     Sex: Female     Is Non-Hispanic African American: Yes     Diabetic: No     Tobacco smoker: No     Systolic Blood Pressure: 123456 mmHg     Is BP treated: No     HDL Cholesterol: 42.3 mg/dL     Total Cholesterol: 146 mg/dL

## 2018-12-29 NOTE — Assessment & Plan Note (Signed)
Advanced directives - packet again today. Would want husband to be HCPOA.Still working on this.

## 2018-12-29 NOTE — Patient Instructions (Addendum)
Flu shot today.  Call breast center to schedule mammogram. If interested, check with pharmacy about new 2 shot shingles series (shingrix).  Advanced directive provided today.  Let us know if any thyroid/throat symptoms like trouble swallowing, or sensation of foreign object in throat to consider thyroid ultrasound.  Good to see you today, call us with questions. Return as needed or in 1 year for next wellness visit.   Health Maintenance After Age 73 After age 49, you are at a higher risk for certain long-term diseases and infections as well as injuries from falls. Falls are a major cause of broken bones and head injuries in people who are older than age 3. Getting regular preventive care can help to keep you healthy and well. Preventive care includes getting regular testing and making lifestyle changes as recommended by your health care provider. Talk with your health care provider about:  Which screenings and tests you should have. A screening is a test that checks for a disease when you have no symptoms.  A diet and exercise plan that is right for you. What should I know about screenings and tests to prevent falls? Screening and testing are the best ways to find a health problem early. Early diagnosis and treatment give you the best chance of managing medical conditions that are common after age 56. Certain conditions and lifestyle choices may make you more likely to have a fall. Your health care provider may recommend:  Regular vision checks. Poor vision and conditions such as cataracts can make you more likely to have a fall. If you wear glasses, make sure to get your prescription updated if your vision changes.  Medicine review. Work with your health care provider to regularly review all of the medicines you are taking, including over-the-counter medicines. Ask your health care provider about any side effects that may make you more likely to have a fall. Tell your health care provider if any  medicines that you take make you feel dizzy or sleepy.  Osteoporosis screening. Osteoporosis is a condition that causes the bones to get weaker. This can make the bones weak and cause them to break more easily.  Blood pressure screening. Blood pressure changes and medicines to control blood pressure can make you feel dizzy.  Strength and balance checks. Your health care provider may recommend certain tests to check your strength and balance while standing, walking, or changing positions.  Foot health exam. Foot pain and numbness, as well as not wearing proper footwear, can make you more likely to have a fall.  Depression screening. You may be more likely to have a fall if you have a fear of falling, feel emotionally low, or feel unable to do activities that you used to do.  Alcohol use screening. Using too much alcohol can affect your balance and may make you more likely to have a fall. What actions can I take to lower my risk of falls? General instructions  Talk with your health care provider about your risks for falling. Tell your health care provider if: ? You fall. Be sure to tell your health care provider about all falls, even ones that seem minor. ? You feel dizzy, sleepy, or off-balance.  Take over-the-counter and prescription medicines only as told by your health care provider. These include any supplements.  Eat a healthy diet and maintain a healthy weight. A healthy diet includes low-fat dairy products, low-fat (lean) meats, and fiber from whole grains, beans, and lots of fruits and vegetables.  Home safety  Remove any tripping hazards, such as rugs, cords, and clutter.  Install safety equipment such as grab bars in bathrooms and safety rails on stairs.  Keep rooms and walkways well-lit. Activity   Follow a regular exercise program to stay fit. This will help you maintain your balance. Ask your health care provider what types of exercise are appropriate for you.  If you  need a cane or walker, use it as recommended by your health care provider.  Wear supportive shoes that have nonskid soles. Lifestyle  Do not drink alcohol if your health care provider tells you not to drink.  If you drink alcohol, limit how much you have: ? 0-1 drink a day for women. ? 0-2 drinks a day for men.  Be aware of how much alcohol is in your drink. In the [place] one drink equals one typical bottle of beer (12 oz), one-half glass of wine (5 oz), or one shot of hard liquor (1 oz).  Do not use any products that contain nicotine or tobacco, such as cigarettes and e-cigarettes. If you need help quitting, ask your health care provider. Summary  Having a healthy lifestyle and getting preventive care can help to protect your health and wellness after age 49.  Screening and testing are the best way to find a health problem early and help you avoid having a fall. Early diagnosis and treatment give you the best chance for managing medical conditions that are more common for people who are older than age 23.  Falls are a major cause of broken bones and head injuries in people who are older than age 40. Take precautions to prevent a fall at home.  Work with your health care provider to learn what changes you can make to improve your health and wellness and to prevent falls. This information is not intended to replace advice given to you by your health care provider. Make sure you discuss any questions you have with your health care provider. Document Released: 01/08/2017 Document Revised: 06/18/2018 Document Reviewed: 01/08/2017 Elsevier Patient Education  2020 [employer].

## 2018-12-29 NOTE — Assessment & Plan Note (Signed)
Palpable R thyroid nodule today.  Declines thyroid US at this time. Declines compressive symptoms.  H/o benign thyroid biopsy 15+ yrs ago in [place].

## 2019-02-25 ENCOUNTER — Other Ambulatory Visit: Payer: Self-pay | Admitting: Family Medicine

## 2019-06-10 ENCOUNTER — Encounter: Payer: Self-pay | Admitting: Family Medicine

## 2019-06-10 ENCOUNTER — Ambulatory Visit (INDEPENDENT_AMBULATORY_CARE_PROVIDER_SITE_OTHER): Payer: Medicare Other | Admitting: Family Medicine

## 2019-06-10 ENCOUNTER — Other Ambulatory Visit: Payer: Self-pay

## 2019-06-10 VITALS — BP 134/66 | HR 81 | Temp 97.8°F | Ht 61.5 in | Wt 171.4 lb

## 2019-06-10 DIAGNOSIS — B0239 Other herpes zoster eye disease: Secondary | ICD-10-CM | POA: Diagnosis not present

## 2019-06-10 DIAGNOSIS — B029 Zoster without complications: Secondary | ICD-10-CM | POA: Diagnosis not present

## 2019-06-10 MED ORDER — VALACYCLOVIR HCL 1 G PO TABS: 1000.0000 mg | ORAL_TABLET | Freq: Three times a day (TID) | ORAL | 0 refills | Status: DC

## 2019-06-10 NOTE — Patient Instructions (Addendum)
I do think this is shingles, location is worrisome for affecting eye so we will send you to eye doctor.  Start valtrex anti viral therapy three times daily with meals  for 1 week  Let us know if any pain develops.  May use tylenol/ibuprofen for now.   Shingles  Shingles, which is also known as herpes zoster, is an infection that causes a painful skin rash and fluid-filled blisters. It is caused by a virus. Shingles only develops in people who:  Have had chickenpox.  Have been given a medicine to protect against chickenpox (have been vaccinated). Shingles is rare in this group. What are the causes? Shingles is caused by varicella-zoster virus (VZV). This is the same virus that causes chickenpox. After a person is exposed to VZV, the virus stays in the body in an inactive (dormant) state. Shingles develops if the virus is reactivated. This can happen many years after the first (initial) exposure to VZV. It is not known what causes this virus to be reactivated. What increases the risk? People who have had chickenpox or received the chickenpox vaccine are at risk for shingles. Shingles infection is more common in people who:  Are older than age 41.  Have a weakened disease-fighting system (immune system), such as people with: ? HIV. ? AIDS. ? Cancer.  Are taking medicines that weaken the immune system, such as transplant medicines.  Are experiencing a lot of stress. What are the signs or symptoms? Early symptoms of this condition include itching, tingling, and pain in an area on your skin. Pain may be described as burning, stabbing, or throbbing. A few days or weeks after early symptoms start, a painful red rash appears. The rash is usually on one side of the body and has a band-like or belt-like pattern. The rash eventually turns into fluid-filled blisters that break open, change into scabs, and dry up in about 2-3 weeks. At any time during the infection, you may also develop:  A  fever.  Chills.  A headache.  An upset stomach. How is this diagnosed? This condition is diagnosed with a skin exam. Skin or fluid samples may be taken from the blisters before a diagnosis is made. These samples are examined under a microscope or sent to a lab for testing. How is this treated? The rash may last for several weeks. There is not a specific cure for this condition. Your health care provider will probably prescribe medicines to help you manage pain, recover more quickly, and avoid long-term problems. Medicines may include:  Antiviral drugs.  Anti-inflammatory drugs.  Pain medicines.  Anti-itching medicines (antihistamines). If the area involved is on your face, you may be referred to a specialist, such as an eye doctor (ophthalmologist) or an ear, nose, and throat (ENT) doctor (otolaryngologist) to help you avoid eye problems, chronic pain, or disability. Follow these instructions at home: Medicines  Take over-the-counter and prescription medicines only as told by your health care provider.  Apply an anti-itch cream or numbing cream to the affected area as told by your health care provider. Relieving itching and discomfort   Apply cold, wet cloths (cold compresses) to the area of the rash or blisters as told by your health care provider.  Cool baths can be soothing. Try adding baking soda or dry oatmeal to the water to reduce itching. Do not bathe in hot water. Blister and rash care  Keep your rash covered with a loose bandage (dressing). Wear loose-fitting clothing to help ease the  pain of material rubbing against the rash.  Keep your rash and blisters clean by washing the area with mild soap and cool water as told by your health care provider.  Check your rash every day for signs of infection. Check for: ? More redness, swelling, or pain. ? Fluid or blood. ? Warmth. ? Pus or a bad smell.  Do not scratch your rash or pick at your blisters. To help avoid  scratching: ? Keep your fingernails clean and cut short. ? Wear gloves or mittens while you sleep, if scratching is a problem. General instructions  Rest as told by your health care provider.  Keep all follow-up visits as told by your health care provider. This is important.  Wash your hands often with soap and water. If soap and water are not available, use hand sanitizer. Doing this lowers your chance of getting a bacterial skin infection.  Before your blisters change into scabs, your shingles infection can cause chickenpox in people who have never had it or have never been vaccinated against it. To prevent this from happening, avoid contact with other people, especially: ? Babies. ? Pregnant women. ? Children who have eczema. ? Elderly people who have transplants. ? People who have chronic illnesses, such as cancer or AIDS. Contact a health care provider if:  Your pain is not relieved with prescribed medicines.  Your pain does not get better after the rash heals.  You have signs of infection in the rash area, such as: ? More redness, swelling, or pain around the rash. ? Fluid or blood coming from the rash. ? The rash area feeling warm to the touch. ? Pus or a bad smell coming from the rash. Get help right away if:  The rash is on your face or nose.  You have facial pain, pain around your eye area, or loss of feeling on one side of your face.  You have difficulty seeing.  You have ear pain or have ringing in your ear.  You have a loss of taste.  Your condition gets worse. Summary  Shingles, which is also known as herpes zoster, is an infection that causes a painful skin rash and fluid-filled blisters.  This condition is diagnosed with a skin exam. Skin or fluid samples may be taken from the blisters and examined before the diagnosis is made.  Keep your rash covered with a loose bandage (dressing). Wear loose-fitting clothing to help ease the pain of material rubbing  against the rash.  Before your blisters change into scabs, your shingles infection can cause chickenpox in people who have never had it or have never been vaccinated against it. This information is not intended to replace advice given to you by your health care provider. Make sure you discuss any questions you have with your health care provider. Document Revised: 06/19/2018 Document Reviewed: 10/30/2016 Elsevier Patient Education  2020 [employer].

## 2019-06-10 NOTE — Progress Notes (Signed)
This visit was conducted in person.  BP 134/66 (BP Location: Left Arm, Patient Position: Sitting, Cuff Size: Normal)   Pulse 81   Temp 97.8 F (36.6 C) (Temporal)   Ht 5' 1.5" (1.562 m)   Wt 171 lb 6 oz (77.7 kg)   SpO2 98%   BMI 31.86 kg/m    CC: facial rash Subjective:    Patient ID: Valerie Paul, female    DOB: Mar 04, 1946, 74 y.o.   MRN: [id number]  HPI: Valerie Paul is a 74 y.o. female presenting on 06/10/2019 for Rash (C/o rash on left side of face.  Seems to be improving.  Noticed about 1 wk ago.  Area itches and has some redness. Tried applying Vaseline.  )   1 wk h/o discomfort to R forehead with rash that popped up last Friday. Area tingles. Felt soreness to L LN at ankle of jaw.   She has recently tried new mixed Teaching laboratory technician mixed with Pantene - she had used both previously.   No fevers/chills, ear pain, eye pain, vision changes, muffled hearing, ear drainage. No oral lesions. No headaches or malaise.   No new lotions, detergents, soaps.  No new medicines.  No new OTC or supplements.   She hasn't had shingrix vaccine. She has not had shingles in the past She has had chicken pox.   Husband had AKA on veteran's day 2020 - increased stress as caregiver.      Relevant past medical, surgical, family and social history reviewed and updated as indicated. Interim medical history since our last visit reviewed. Allergies and medications reviewed and updated. Outpatient Medications Prior to Visit  Medication Sig Dispense Refill  . aspirin 81 MG tablet Take 1 tablet (81 mg total) by mouth 2 (two) times a week. (Patient taking differently: Take 81 mg by mouth once a week. ) 30 tablet   . atorvastatin (LIPITOR) 20 MG tablet TAKE 1 TABLET BY MOUTH EVERY DAY 90 tablet 3  . cholecalciferol (VITAMIN D) 1000 UNITS tablet Take 1,000 Units by mouth daily.    Marland Kitchen GARLIC PO Take by mouth daily.    . Multiple Vitamin (MULTIVITAMIN) tablet Take 1 tablet by mouth daily.    . vitamin C  (ASCORBIC ACID) 500 MG tablet Take 500 mg by mouth daily.     No facility-administered medications prior to visit.     Per HPI unless specifically indicated in ROS section below Review of Systems Objective:    BP 134/66 (BP Location: Left Arm, Patient Position: Sitting, Cuff Size: Normal)   Pulse 81   Temp 97.8 F (36.6 C) (Temporal)   Ht 5' 1.5" (1.562 m)   Wt 171 lb 6 oz (77.7 kg)   SpO2 98%   BMI 31.86 kg/m   Wt Readings from Last 3 Encounters:  06/10/19 171 lb 6 oz (77.7 kg)  12/29/18 167 lb 2 oz (75.8 kg)  02/26/18 167 lb (75.8 kg)    Physical Exam Vitals and nursing note reviewed.  Constitutional:      Appearance: Normal appearance. She is not ill-appearing.  HENT:     Head: Normocephalic and atraumatic.      Right Ear: Tympanic membrane, ear canal and external ear normal. There is no impacted cerumen.     Left Ear: Tympanic membrane, ear canal and external ear normal. There is no impacted cerumen.     Nose: Nose normal.     Mouth/Throat:     Mouth: Mucous membranes are moist.  Pharynx: Oropharynx is clear. No oropharyngeal exudate or posterior oropharyngeal erythema.  Eyes:     General:        Right eye: No discharge.        Left eye: No discharge.     Extraocular Movements: Extraocular movements intact.     Conjunctiva/sclera: Conjunctivae normal.     Pupils: Pupils are equal, round, and reactive to light.     Comments: No noted eye involvement  Lymphadenopathy:     Head:     Right side of head: No submental, submandibular, tonsillar, preauricular, posterior auricular or occipital adenopathy.     Left side of head: Tonsillar and preauricular adenopathy present. No submental, submandibular, posterior auricular or occipital adenopathy.     Cervical: No cervical adenopathy.     Comments: Discomfort along L tonsillar/preauricular LN without obvious LAD  Skin:    General: Skin is warm and dry.     Findings: Erythema and rash present.     Comments: Scabbed  rash with slight surrounding erythema (with some clusters of lesions to L forehead into scalp) along distribution of ophthalmic branch (V1) of trigeminal nerve  Neurological:     Mental Status: She is alert.  Psychiatric:        Mood and Affect: Mood normal.        Behavior: Behavior normal.       Lab Results  Component Value Date   CREATININE 0.93 12/22/2018   BUN 12 12/22/2018   NA 143 12/22/2018   K 4.4 12/22/2018   CL 106 12/22/2018   CO2 30 12/22/2018    Assessment & Plan:  This visit occurred during the SARS-CoV-2 public health emergency.  Safety protocols were in place, including screening questions prior to the visit, additional usage of staff PPE, and extensive cleaning of exam room while observing appropriate contact time as indicated for disinfecting solutions.   Problem List Items Addressed This Visit    Herpes zoster without complication - Primary    Concern for shingles affecting V1 branch of trigeminal nerve however eye doesn't seem to be affected at this time - will refer for urgent ophthalmology evaluation.  Delayed presentation (waited almost 1 week for evaluation) however given location I do recommend treatment with valtrex.  Update if not improving, reviewed signs to suggest bacterial super-infection.  Patient agrees with plan.      Relevant Medications   valACYclovir (VALTREX) 1000 MG tablet   Other Relevant Orders   Ambulatory referral to Ophthalmology       Meds ordered this encounter  Medications  . valACYclovir (VALTREX) 1000 MG tablet    Sig: Take 1 tablet (1,000 mg total) by mouth 3 (three) times daily.    Dispense:  21 tablet    Refill:  0   Orders Placed This Encounter  Procedures  . Ambulatory referral to Ophthalmology    Referral Priority:   Urgent    Referral Type:   Consultation    Referral Reason:   Specialty Services Required    Requested Specialty:   Ophthalmology    Number of Visits Requested:   1   Patient instructions: I do  think this is shingles, location is worrisome for affecting eye so we will send you to eye doctor.  Start valtrex anti viral therapy three times daily with meals  for 1 week  Let us know if any pain develops.  May use tylenol/ibuprofen for now.    Follow up plan: Return if symptoms worsen or fail  to improve.  Ria Bush, MD

## 2019-06-11 NOTE — Assessment & Plan Note (Addendum)
Concern for shingles affecting V1 branch of trigeminal nerve however eye doesn't seem to be affected at this time - will refer for urgent ophthalmology evaluation.  Delayed presentation (waited almost 1 week for evaluation) however given location I do recommend treatment with valtrex.  Update if not improving, reviewed signs to suggest bacterial super-infection.  Patient agrees with plan.

## 2019-06-24 DIAGNOSIS — B0239 Other herpes zoster eye disease: Secondary | ICD-10-CM | POA: Diagnosis not present

## 2019-11-29 ENCOUNTER — Telehealth: Payer: Self-pay | Admitting: Family Medicine

## 2019-11-29 DIAGNOSIS — H524 Presbyopia: Secondary | ICD-10-CM | POA: Diagnosis not present

## 2019-11-29 DIAGNOSIS — H2513 Age-related nuclear cataract, bilateral: Secondary | ICD-10-CM | POA: Diagnosis not present

## 2019-11-29 DIAGNOSIS — H04123 Dry eye syndrome of bilateral lacrimal glands: Secondary | ICD-10-CM | POA: Diagnosis not present

## 2019-11-29 DIAGNOSIS — H43393 Other vitreous opacities, bilateral: Secondary | ICD-10-CM | POA: Diagnosis not present

## 2019-11-29 NOTE — Telephone Encounter (Signed)
Pt schedule appointment 9/22 for shoulder and hand pain right side.  She wanted to know if she can take tylenol until she can be seen in office

## 2019-11-29 NOTE — Telephone Encounter (Signed)
Ok to take tylenol 500mg  TID PRN pains.

## 2019-11-29 NOTE — Telephone Encounter (Signed)
Lvm asking pt to call back.  Need to relay Dr. G's message.  

## 2019-11-30 NOTE — Telephone Encounter (Signed)
Lvm asking pt to call back.  Need to relay Dr. G's message.  

## 2019-11-30 NOTE — Telephone Encounter (Signed)
Spoke with pt relaying Dr. G's message.  Verbalizes understanding.  

## 2019-12-01 ENCOUNTER — Other Ambulatory Visit: Payer: Self-pay

## 2019-12-01 ENCOUNTER — Ambulatory Visit (INDEPENDENT_AMBULATORY_CARE_PROVIDER_SITE_OTHER): Payer: Medicare Other | Admitting: Family Medicine

## 2019-12-01 ENCOUNTER — Encounter: Payer: Self-pay | Admitting: Family Medicine

## 2019-12-01 VITALS — BP 120/74 | HR 81 | Temp 97.7°F | Ht 61.5 in | Wt 167.4 lb

## 2019-12-01 DIAGNOSIS — M5412 Radiculopathy, cervical region: Secondary | ICD-10-CM | POA: Insufficient documentation

## 2019-12-01 DIAGNOSIS — Z23 Encounter for immunization: Secondary | ICD-10-CM | POA: Diagnosis not present

## 2019-12-01 DIAGNOSIS — S29012A Strain of muscle and tendon of back wall of thorax, initial encounter: Secondary | ICD-10-CM | POA: Diagnosis not present

## 2019-12-01 MED ORDER — METHOCARBAMOL 500 MG PO TABS: 500.0000 mg | ORAL_TABLET | Freq: Three times a day (TID) | ORAL | 0 refills | Status: DC | PRN

## 2019-12-01 MED ORDER — PREDNISONE 20 MG PO TABS: ORAL_TABLET | ORAL | 0 refills | Status: DC

## 2019-12-01 NOTE — Assessment & Plan Note (Signed)
Exam consistent with possible cervical radiculopathy as well - will await effect of prednisone + methocarbamol course. Consider PT vs imaging if ongoing or worsening. Pt agrees with plan.

## 2019-12-01 NOTE — Progress Notes (Signed)
This visit was conducted in person.  BP 120/74 (BP Location: Left Arm, Patient Position: Sitting, Cuff Size: Normal)   Pulse 81   Temp 97.7 F (36.5 C) (Temporal)   Ht 5' 1.5" (1.562 m)   Wt 167 lb 6 oz (75.9 kg)   SpO2 97%   BMI 31.11 kg/m    CC: R shoulder pain Subjective:    Patient ID: Valerie Paul, female    DOB: [birth year], 74 y.o.   MRN: [id number]  HPI: Valerie Paul is a 74 y.o. female presenting on 12/01/2019 for Shoulder Pain (C/o right shoulder pain radiating to posterior shoulder down arm and into thumb.  Started about 2 wks ago.  Pain is more constant now.  Tried Tylenol and Aspercream. )   1-2 wk h/o R shoulder pain with radiation down posterior upper arm into thumb. Initially intermittent, now getting more continuous. Possible numbness and weakness to R hand with prolonged use - ie curling hair. Shoulder ROM preserved   No neck pain, fevers/chills, numbness or weakness of arm   Managing with tylenol, aspercream with limited benefit.  Denies inciting trauma/injury or fall.       Relevant past medical, surgical, family and social history reviewed and updated as indicated. Interim medical history since our last visit reviewed. Allergies and medications reviewed and updated. Outpatient Medications Prior to Visit  Medication Sig Dispense Refill  . aspirin 81 MG tablet Take 1 tablet (81 mg total) by mouth 2 (two) times a week. (Patient taking differently: Take 81 mg by mouth once a week. ) 30 tablet   . atorvastatin (LIPITOR) 20 MG tablet TAKE 1 TABLET BY MOUTH EVERY DAY 90 tablet 3  . cholecalciferol (VITAMIN D) 1000 UNITS tablet Take 1,000 Units by mouth daily.    Marland Kitchen GARLIC PO Take by mouth daily.    . Multiple Vitamin (MULTIVITAMIN) tablet Take 1 tablet by mouth daily.    . vitamin C (ASCORBIC ACID) 500 MG tablet Take 500 mg by mouth daily.    . valACYclovir (VALTREX) 1000 MG tablet Take 1 tablet (1,000 mg total) by mouth 3 (three) times daily. 21 tablet 0   No  facility-administered medications prior to visit.     Per HPI unless specifically indicated in ROS section below Review of Systems Objective:  BP 120/74 (BP Location: Left Arm, Patient Position: Sitting, Cuff Size: Normal)   Pulse 81   Temp 97.7 F (36.5 C) (Temporal)   Ht 5' 1.5" (1.562 m)   Wt 167 lb 6 oz (75.9 kg)   SpO2 97%   BMI 31.11 kg/m   Wt Readings from Last 3 Encounters:  12/01/19 167 lb 6 oz (75.9 kg)  06/10/19 171 lb 6 oz (77.7 kg)  12/29/18 167 lb 2 oz (75.8 kg)      Physical Exam Vitals and nursing note reviewed.  Constitutional:      Appearance: Normal appearance. She is not ill-appearing.  Musculoskeletal:        General: Tenderness present. No deformity.     Cervical back: No rigidity.     Comments:  No midline cervical spine tenderness ++ pain to palpation of R rhomboids  Limited ROM of cervical neck to R lateral flexion and rotation due to pain FROM at shoulders R shoulder exam: No deformity of shoulders on inspection. No pain with palpation of shoulder landmarks. FROM in abduction and forward flexion. No pain or weakness with testing SITS in ext/int rotation. No pain with empty can sign. Discomfort with  Speed test. No impingement. No pain with rotation of humeral head in [facility] joint.   Lymphadenopathy:     Cervical: No cervical adenopathy.  Skin:    General: Skin is warm and dry.     Findings: No erythema or rash.  Neurological:     Mental Status: She is alert.     Sensory: Sensation is intact.     Motor: Motor function is intact.     Comments:  + spurling on right 5/5 strength BUE Sensation intact to light touch       Lab Results  Component Value Date   CREATININE 0.93 12/22/2018   BUN 12 12/22/2018   NA 143 12/22/2018   K 4.4 12/22/2018   CL 106 12/22/2018   CO2 30 12/22/2018    Assessment & Plan:  This visit occurred during the SARS-CoV-2 public health emergency.  Safety protocols were in place, including screening questions  prior to the visit, additional usage of staff PPE, and extensive cleaning of exam room while observing appropriate contact time as indicated for disinfecting solutions.   Problem List Items Addressed This Visit    Rhomboid muscle strain, initial encounter - Primary    Exam consistent with R rhomboid strain - rec prednisone course, methocarbamol, heating pad, and stretching exercises provided from [facility] pt advisor. Also ok to continue topical treatments to date.  Update if not improving with treatment.       Cervical radicular pain    Exam consistent with possible cervical radiculopathy as well - will await effect of prednisone + methocarbamol course. Consider PT vs imaging if ongoing or worsening. Pt agrees with plan.        Other Visit Diagnoses    Need for influenza vaccination       Relevant Orders   Flu Vaccine QUAD High Dose(Fluad) (Completed)       Meds ordered this encounter  Medications  . predniSONE (DELTASONE) 20 MG tablet    Sig: Take two tablets daily for 3 days followed by one tablet daily for 4 days    Dispense:  10 tablet    Refill:  0  . methocarbamol (ROBAXIN) 500 MG tablet    Sig: Take 1 tablet (500 mg total) by mouth 3 (three) times daily as needed for muscle spasms (sedation precautions).    Dispense:  40 tablet    Refill:  0   Orders Placed This Encounter  Procedures  . Flu Vaccine QUAD High Dose(Fluad)    Patient Instructions  Flu shot today I think you have rhomboid strain on the right as well as possible pinching of nerve in neck.  Treat with prednisone taper, methocarbamol muscle relaxant (sedation precautions), and exercises provided today  Ok to continue aspercream, use heating pad to the back  Let us know if not improving with this.   Follow up plan: No follow-ups on file.  Ria Bush, MD

## 2019-12-01 NOTE — Patient Instructions (Addendum)
Flu shot today I think you have rhomboid strain on the right as well as possible pinching of nerve in neck.  Treat with prednisone taper, methocarbamol muscle relaxant (sedation precautions), and exercises provided today  Ok to continue aspercream, use heating pad to the back  Let us know if not improving with this.

## 2019-12-01 NOTE — Assessment & Plan Note (Signed)
Exam consistent with R rhomboid strain - rec prednisone course, methocarbamol, heating pad, and stretching exercises provided from [facility] pt advisor. Also ok to continue topical treatments to date.  Update if not improving with treatment.

## 2020-01-18 ENCOUNTER — Ambulatory Visit: Payer: Medicare Other | Attending: Internal Medicine

## 2020-01-18 DIAGNOSIS — Z23 Encounter for immunization: Secondary | ICD-10-CM

## 2020-01-18 NOTE — Progress Notes (Signed)
   Covid-[facility] Vaccination [facility]  Name:  Elyza Whitt    MRN: [id number] DOB: 11/06/1945  01/18/2020  Ms. Fearn was observed post Covid-19 immunization for 15 minutes without incident. She was provided with Vaccine Information Sheet and instruction to access the V-Safe system.   Ms. Lynn was instructed to call 911 with any severe reactions post vaccine: Marland Kitchen Difficulty breathing  . Swelling of face and throat  . A fast heartbeat  . A bad rash all over body  . Dizziness and weakness

## 2020-02-23 ENCOUNTER — Other Ambulatory Visit: Payer: Self-pay | Admitting: Family Medicine

## 2020-02-23 NOTE — Telephone Encounter (Signed)
E-scribed refill.  Plz schedule wellness, lab and cpe visits.  

## 2020-04-07 ENCOUNTER — Ambulatory Visit (INDEPENDENT_AMBULATORY_CARE_PROVIDER_SITE_OTHER): Payer: Medicare Other

## 2020-04-07 ENCOUNTER — Other Ambulatory Visit: Payer: Self-pay

## 2020-04-07 DIAGNOSIS — Z0000 Encounter for general adult medical examination without abnormal findings: Secondary | ICD-10-CM | POA: Diagnosis not present

## 2020-04-07 NOTE — Progress Notes (Signed)
PCP notes:  Health Maintenance: Mammogram- due    Abnormal Screenings: none   Patient concerns: Mole on left side of face    Nurse concerns: none   Next PCP appt.: 05/08/2020 @ 10:30 am

## 2020-04-07 NOTE — Progress Notes (Signed)
Subjective:   Valerie Paul is a 75 y.o. female who presents for Medicare Annual (Subsequent) preventive examination.  Review of Systems: N/A       I connected with patient today by a video enabled telemedicine application and verified that I am speaking with the correct person using two identifiers.  Location patient: home  Location nurse: work  Persons participating in the virtual visit: patient, nurse     I discussed the limitations, risks, security, and privacy concerns of performing an evaluation and management service by telephone and the availability of in person appointments. The patient expressed understanding and agreed to proceed.       Cardiac Risk Factors include: advanced age (>66men, >89 women);Other (see comment), Risk factor comments: hyperlipidemia     Objective:    Today's Vitals   There is no height or weight on file to calculate BMI.  Advanced Directives 04/07/2020 12/22/2018 12/17/2017 11/15/2016 01/20/2016 06/30/2015  Does Patient Have a Medical Advance Directive? No No No No No No  Does patient want to make changes to medical advance directive? No - Patient declined - - - - -  Would patient like information on creating a medical advance directive? - No - Patient declined No - Patient declined - No - patient declined information No - patient declined information    Current Medications (verified) Outpatient Encounter Medications as of 04/07/2020  Medication Sig  . aspirin 81 MG tablet Take 1 tablet (81 mg total) by mouth 2 (two) times a week. (Patient taking differently: Take 81 mg by mouth once a week.)  . atorvastatin (LIPITOR) 20 MG tablet TAKE 1 TABLET BY MOUTH EVERY DAY  . cholecalciferol (VITAMIN D) 1000 UNITS tablet Take 1,000 Units by mouth daily.  Marland Kitchen GARLIC PO Take by mouth daily.  . Multiple Vitamin (MULTIVITAMIN) tablet Take 1 tablet by mouth daily.  . vitamin C (ASCORBIC ACID) 500 MG tablet Take 500 mg by mouth daily.  . methocarbamol (ROBAXIN)  500 MG tablet Take 1 tablet (500 mg total) by mouth 3 (three) times daily as needed for muscle spasms (sedation precautions). (Patient not taking: Reported on 04/07/2020)  . predniSONE (DELTASONE) 20 MG tablet Take two tablets daily for 3 days followed by one tablet daily for 4 days (Patient not taking: Reported on 04/07/2020)   No facility-administered encounter medications on file as of 04/07/2020.    Allergies (verified) Patient has no known allergies.   History: Past Medical History:  Diagnosis Date  . Allergy   . Anemia   . Arthritis   . Blood transfusion without reported diagnosis   . 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 History of anemia   . HLD (hyperlipidemia)   . Infertility 1970s   tube blockage, G0P0  . Seasonal allergies    pollen   Past Surgical History:  Procedure Laterality Date  . APPENDECTOMY  1977  . BIOPSY THYROID  2006   normal per pt ([facility])  . COLONOSCOPY  02/2018   4 HP polyps, consider rpt 10 yrs ([facility])  . DEXA  08/2015   WNL  . DILATION AND CURETTAGE OF UTERUS    . LAPAROSCOPY  1982  . MYOMECTOMY  1977   fibroids  . OVARIAN CYST REMOVAL     right   Family History  Problem Relation Age of Onset  . Cancer Brother 77       lung (smoker)  . Cancer Mother 43       brain  .  Heart failure Father   . Hypertension Maternal Aunt   . Learning disabilities Daughter   . CAD Neg Hx   . Stroke Neg Hx   . Diabetes Neg Hx   . Colon cancer Neg Hx   . Esophageal cancer Neg Hx   . Stomach cancer Neg Hx   . Rectal cancer Neg Hx    Social History   Socioeconomic History  . Marital status: Married    Spouse name: Not on file  . Number of children: Not on file  . Years of education: Not on file  . Highest education level: Not on file  Occupational History  . Not on file  Tobacco Use  . Smoking status: Former Smoker    Packs/day: 1.00    Start date: 03/12/1963    Quit date: 03/12/2003    Years since quitting: 17.0  . Smokeless tobacco:  Never Used  Vaping Use  . Vaping Use: Never used  Substance and Sexual Activity  . Alcohol use: Yes    Comment: Rare  . Drug use: No  . Sexual activity: Never  Other Topics Concern  . Not on file  Social History Narrative   Lives with husband, no pets   Occupation: retired, was Engineer, mining   Edu: BS business administration   Activity: exercise class 3x/wk, walking    Diet: good water, some fruits/vegetables   Social Determinants of Health   Financial Resource Strain: Low Risk   . Difficulty of Paying Living Expenses: Not hard at all  Food Insecurity: No Food Insecurity  . Worried About Charity fundraiser in the Last Year: Never true  . Ran Out of Food in the Last Year: Never true  Transportation Needs: No Transportation Needs  . Lack of Transportation (Medical): No  . Lack of Transportation (Non-Medical): No  Physical Activity: Inactive  . Days of Exercise per Week: 0 days  . Minutes of Exercise per Session: 0 min  Stress: No Stress Concern Present  . Feeling of Stress : Not at all  Social Connections: Not on file    Tobacco Counseling Counseling given: Not Answered   Clinical Intake:  Pre-visit preparation completed: Yes  Pain : No/denies pain     Diabetes: No  How often do you need to have someone help you when you read instructions, pamphlets, or other written materials from your doctor or pharmacy?: 1 - Never What is the last grade level you completed in school?: college graduate  Diabetic: No Nutrition Risk Assessment:  Has the patient had any N/V/D within the last 2 months?  No  Does the patient have any non-healing wounds?  No  Has the patient had any unintentional weight loss or weight gain?  No   Diabetes:  Is the patient diabetic?  No  If diabetic, was a CBG obtained today?  N/A Did the patient bring in their glucometer from home?  N/A How often do you monitor your CBG's? N/A.   Financial Strains and Diabetes Management:  Are you  having any financial strains with the device, your supplies or your medication? N/A.  Does the patient want to be seen by Chronic Care Management for management of their diabetes?  N/A Would the patient like to be referred to a Nutritionist or for Diabetic Management?  N/A    Interpreter Needed?: No  Information entered by :: CJohnson, LPN   Activities of Daily Living In your present state of health, do you have any difficulty performing the  following activities: 04/07/2020  Hearing? N  Vision? N  Difficulty concentrating or making decisions? N  Walking or climbing stairs? N  Dressing or bathing? N  Doing errands, shopping? N  Preparing Food and eating ? N  Using the Toilet? N  In the past six months, have you accidently leaked urine? N  Do you have problems with loss of bowel control? N  Managing your Medications? N  Managing your Finances? N  Housekeeping or managing your Housekeeping? N  Some recent data might be hidden    Patient Care Team: Ria Bush, MD as PCP - General (Family Medicine)  Indicate any recent Medical Services you may have received from other than [facility] providers in the past year (date may be approximate).     Assessment:   This is a routine wellness examination for Chiquita.  Hearing/Vision screen  Hearing Screening   125Hz  250Hz  500Hz  1000Hz  2000Hz  3000Hz  4000Hz  6000Hz  8000Hz   Right ear:           Left ear:           Vision Screening Comments: Patient gets annual eye exams   Dietary issues and exercise activities discussed: Current Exercise Habits: The patient does not participate in regular exercise at present, Exercise limited by: None identified  Goals    . Increase physical activity     Starting 12/17/2017, I will continue to exercise for at least 4 hours 3 days per week.     . Patient Stated     12/22/2018, I will start back trying to exercise daily.    . Patient Stated     04/07/2020, I will maintain and continue medications as  prescribed.       Depression Screen PHQ 2/9 Scores 04/07/2020 12/22/2018 12/17/2017 11/15/2016 06/30/2015 01/12/2014 01/08/2013  PHQ - 2 Score 0 0 0 0 0 0 0  PHQ- 9 Score 0 0 0 2 - - -    Fall Risk Fall Risk  04/07/2020 12/22/2018 12/17/2017 11/15/2016 10/07/2016  Falls in the past year? 0 0 No No No  Comment - - - - Emmi Telephone Survey: data to providers prior to load  Number falls in past yr: 0 - - - -  Injury with Fall? 0 - - - -  Risk for fall due to : No Fall Risks Medication side effect - - -  Follow up Falls evaluation completed;Falls prevention discussed Falls evaluation completed;Falls prevention discussed - - -    FALL RISK PREVENTION PERTAINING TO THE HOME:  Any stairs in or around the home? Yes  If so, are there any without handrails? No  Home free of loose throw rugs in walkways, pet beds, electrical cords, etc? Yes  Adequate lighting in your home to reduce risk of falls? Yes   ASSISTIVE DEVICES UTILIZED TO PREVENT FALLS:  Life alert? No  Use of a cane, walker or w/c? No  Grab bars in the bathroom? No  Shower chair or bench in shower? No  Elevated toilet seat or a handicapped toilet? No   TIMED UP AND GO:  Was the test performed? N/A telephone/virtual visit.    Cognitive Function: MMSE - Mini Mental State Exam 04/07/2020 12/22/2018 12/17/2017 11/15/2016 06/30/2015  Orientation to time 5 5 5 5 5   Orientation to Place 5 5 5 5 5   Registration 3 3 3 3 3   Attention/ Calculation 5 5 0 0 0  Recall 3 3 3 3 3   Language- name 2 objects - - 0 0  0  Language- repeat 1 1 1 1 1   Language- follow 3 step command - - 3 3 3   Language- read & follow direction - - 0 0 0  Write a sentence - - 0 0 0  Copy design - - 0 0 0  Total score - - 20 20 20   Mini Cog  Mini-Cog screen was completed. Maximum score is 22. A value of 0 denotes this part of the MMSE was not completed or the patient failed this part of the Mini-Cog screening.       Immunizations Immunization History   Administered Date(s) Administered  . Fluad Quad(high Dose 65+) 12/29/2018, 12/01/2019  . Influenza,inj,Quad PF,6+ Mos 01/08/2013, 02/06/2016, 12/17/2017  . Influenza-Unspecified 12/01/2013  . Moderna SARS-COV2 Booster Vaccination 01/18/2020  . Moderna Sars-Covid-2 Vaccination 04/06/2019, 05/04/2019  . Pneumococcal Conjugate-13 01/12/2014  . Pneumococcal Polysaccharide-23 01/08/2013    TDAP status: Due, Education has been provided regarding the importance of this vaccine. Advised may receive this vaccine at local pharmacy or Health Dept. Aware to provide a copy of the vaccination record if obtained from local pharmacy or Health Dept. Verbalized acceptance and understanding.  Flu Vaccine status: Up to date  Pneumococcal vaccine status: Up to date  Covid-19 vaccine status: Completed vaccines  Qualifies for Shingles Vaccine? Yes   Zostavax completed No   Shingrix Completed?: No.    Education has been provided regarding the importance of this vaccine. Patient has been advised to call insurance company to determine out of pocket expense if they have not yet received this vaccine. Advised may also receive vaccine at local pharmacy or Health Dept. Verbalized acceptance and understanding.  Screening Tests Health Maintenance  Topic Date Due  . MAMMOGRAM  08/10/2017  . TETANUS/TDAP  06/30/2023 (Originally 12/03/1964)  . COLONOSCOPY (Pts 45-81yrs Insurance coverage will need to be confirmed)  02/27/2028  . INFLUENZA VACCINE  Completed  . DEXA SCAN  Completed  . COVID-19 Vaccine  Completed  . Hepatitis C Screening  Completed  . PNA vac Low Risk Adult  Completed    Health Maintenance  Health Maintenance Due  Topic Date Due  . MAMMOGRAM  08/10/2017    Colorectal cancer screening: Type of screening: Colonoscopy. Completed 02/26/2018. Repeat every 10 years  Mammogram status: due, Patient will schedule appointment   Bone Density status: Completed 08/10/2015. Results reflect: Bone density  results: NORMAL. Repeat every 2-5 years.  Lung Cancer Screening: (Low Dose CT Chest recommended if Age 53-80 years, 30 pack-year currently smoking OR have quit w/in 15 years.) does not qualify.    Additional Screening:  Hepatitis C Screening: does qualify; Completed 06/30/2015  Vision Screening: Recommended annual ophthalmology exams for early detection of glaucoma and other disorders of the eye. Is the patient up to date with their annual eye exam?  Yes  Who is the provider or what is the name of the office in which the patient attends annual eye exams? [facility]  If pt is not established with a provider, would they like to be referred to a provider to establish care? No .   Dental Screening: Recommended annual dental exams for proper oral hygiene  Community Resource Referral / Chronic Care Management: CRR required this visit?  No   CCM required this visit?  No      Plan:     I have personally reviewed and noted the following in the patient's chart:   . Medical and social history . Use of alcohol, tobacco or illicit drugs  .  Current medications and supplements . Functional ability and status . Nutritional status . Physical activity . Advanced directives . List of other physicians . Hospitalizations, surgeries, and ER visits in previous 12 months . Vitals . Screenings to include cognitive, depression, and falls . Referrals and appointments  In addition, I have reviewed and discussed with patient certain preventive protocols, quality metrics, and best practice recommendations. A written personalized care plan for preventive services as well as general preventive health recommendations were provided to patient.   Due to this being a telephonic/virtual visit, the after visit summary with patients personalized plan was offered to patient via office or my-chart. Patient preferred to pick up at office at next visit or via mychart.   Andrez Grime, LPN   [id number]

## 2020-04-07 NOTE — Patient Instructions (Signed)
Valerie Paul , Thank you for taking time to come for your Medicare Wellness Visit. I appreciate your ongoing commitment to your health goals. Please review the following plan we discussed and let me know if I can assist you in the future.   Screening recommendations/referrals: Colonoscopy: Up to date, completed 02/26/2018, due 02/2028 Mammogram: due, Patient will schedule  Bone Density: Up to date, completed 08/10/2015, due 2-5 years  Recommended yearly ophthalmology/optometry visit for glaucoma screening and checkup Recommended yearly dental visit for hygiene and checkup  Vaccinations: Influenza vaccine: Up to date, completed 12/01/2019, due 10/2020 Pneumococcal vaccine: Completed series Tdap vaccine: decline-insurance Shingles vaccine: due, check with your insurance regarding coverage if interested    Covid-19:Completed series  Advanced directives: Advance directive discussed with you today. Even though you declined this today please call our office should you change your mind and we can give you the proper paperwork for you to fill out.  Conditions/risks identified: hyperlipidemia  Next appointment: Follow up in one year for your annual wellness visit    Preventive Care 65 Years and Older, Female Preventive care refers to lifestyle choices and visits with your health care provider that can promote health and wellness. What does preventive care include?  A yearly physical exam. This is also called an annual well check.  Dental exams once or twice a year.  Routine eye exams. Ask your health care provider how often you should have your eyes checked.  Personal lifestyle choices, including:  Daily care of your teeth and gums.  Regular physical activity.  Eating a healthy diet.  Avoiding tobacco and drug use.  Limiting alcohol use.  Practicing safe sex.  Taking low-dose aspirin every day.  Taking vitamin and mineral supplements as recommended by your health care  provider. What happens during an annual well check? The services and screenings done by your health care provider during your annual well check will depend on your age, overall health, lifestyle risk factors, and family history of disease. Counseling  Your health care provider may ask you questions about your:  Alcohol use.  Tobacco use.  Drug use.  Emotional well-being.  Home and relationship well-being.  Sexual activity.  Eating habits.  History of falls.  Memory and ability to understand (cognition).  Work and work Statistician.  Reproductive health. Screening  You may have the following tests or measurements:  Height, weight, and BMI.  Blood pressure.  Lipid and cholesterol levels. These may be checked every 5 years, or more frequently if you are over 67 years old.  Skin check.  Lung cancer screening. You may have this screening every year starting at age 59 if you have a 30-pack-year history of smoking and currently smoke or have quit within the past 15 years.  Fecal occult blood test (FOBT) of the stool. You may have this test every year starting at age 62.  Flexible sigmoidoscopy or colonoscopy. You may have a sigmoidoscopy every 5 years or a colonoscopy every 10 years starting at age 29.  Hepatitis C blood test.  Hepatitis B blood test.  Sexually transmitted disease (STD) testing.  Diabetes screening. This is done by checking your blood sugar (glucose) after you have not eaten for a while (fasting). You may have this done every 1-3 years.  Bone density scan. This is done to screen for osteoporosis. You may have this done starting at age 69.  Mammogram. This may be done every 1-2 years. Talk to your health care provider about how often you  should have regular mammograms. Talk with your health care provider about your test results, treatment options, and if necessary, the need for more tests. Vaccines  Your health care provider may recommend certain  vaccines, such as:  Influenza vaccine. This is recommended every year.  Tetanus, diphtheria, and acellular pertussis (Tdap, Td) vaccine. You may need a Td booster every 10 years.  Zoster vaccine. You may need this after age 27.  Pneumococcal 13-valent conjugate (PCV13) vaccine. One dose is recommended after age 81.  Pneumococcal polysaccharide (PPSV23) vaccine. One dose is recommended after age 14. Talk to your health care provider about which screenings and vaccines you need and how often you need them. This information is not intended to replace advice given to you by your health care provider. Make sure you discuss any questions you have with your health care provider. Document Released: 03/24/2015 Document Revised: 11/15/2015 Document Reviewed: 12/27/2014 Elsevier Interactive Patient Education  2017 [employer] Prevention in the Home Falls can cause injuries. They can happen to people of all ages. There are many things you can do to make your home safe and to help prevent falls. What can I do on the outside of my home?  Regularly fix the edges of walkways and driveways and fix any cracks.  Remove anything that might make you trip as you walk through a door, such as a raised step or threshold.  Trim any bushes or trees on the path to your home.  Use bright outdoor lighting.  Clear any walking paths of anything that might make someone trip, such as rocks or tools.  Regularly check to see if handrails are loose or broken. Make sure that both sides of any steps have handrails.  Any raised decks and porches should have guardrails on the edges.  Have any leaves, snow, or ice cleared regularly.  Use sand or salt on walking paths during winter.  Clean up any spills in your garage right away. This includes oil or grease spills. What can I do in the bathroom?  Use night lights.  Install grab bars by the toilet and in the tub and shower. Do not use towel bars as grab  bars.  Use non-skid mats or decals in the tub or shower.  If you need to sit down in the shower, use a plastic, non-slip stool.  Keep the floor dry. Clean up any water that spills on the floor as soon as it happens.  Remove soap buildup in the tub or shower regularly.  Attach bath mats securely with double-sided non-slip rug tape.  Do not have throw rugs and other things on the floor that can make you trip. What can I do in the bedroom?  Use night lights.  Make sure that you have a light by your bed that is easy to reach.  Do not use any sheets or blankets that are too big for your bed. They should not hang down onto the floor.  Have a firm chair that has side arms. You can use this for support while you get dressed.  Do not have throw rugs and other things on the floor that can make you trip. What can I do in the kitchen?  Clean up any spills right away.  Avoid walking on wet floors.  Keep items that you use a lot in easy-to-reach places.  If you need to reach something above you, use a strong step stool that has a grab bar.  Keep electrical cords out  of the way.  Do not use floor polish or wax that makes floors slippery. If you must use wax, use non-skid floor wax.  Do not have throw rugs and other things on the floor that can make you trip. What can I do with my stairs?  Do not leave any items on the stairs.  Make sure that there are handrails on both sides of the stairs and use them. Fix handrails that are broken or loose. Make sure that handrails are as long as the stairways.  Check any carpeting to make sure that it is firmly attached to the stairs. Fix any carpet that is loose or worn.  Avoid having throw rugs at the top or bottom of the stairs. If you do have throw rugs, attach them to the floor with carpet tape.  Make sure that you have a light switch at the top of the stairs and the bottom of the stairs. If you do not have them, ask someone to add them for  you. What else can I do to help prevent falls?  Wear shoes that:  Do not have high heels.  Have rubber bottoms.  Are comfortable and fit you well.  Are closed at the toe. Do not wear sandals.  If you use a stepladder:  Make sure that it is fully opened. Do not climb a closed stepladder.  Make sure that both sides of the stepladder are locked into place.  Ask someone to hold it for you, if possible.  Clearly mark and make sure that you can see:  Any grab bars or handrails.  First and last steps.  Where the edge of each step is.  Use tools that help you move around (mobility aids) if they are needed. These include:  Canes.  Walkers.  Scooters.  Crutches.  Turn on the lights when you go into a dark area. Replace any light bulbs as soon as they burn out.  Set up your furniture so you have a clear path. Avoid moving your furniture around.  If any of your floors are uneven, fix them.  If there are any pets around you, be aware of where they are.  Review your medicines with your doctor. Some medicines can make you feel dizzy. This can increase your chance of falling. Ask your doctor what other things that you can do to help prevent falls. This information is not intended to replace advice given to you by your health care provider. Make sure you discuss any questions you have with your health care provider. Document Released: 12/22/2008 Document Revised: 08/03/2015 Document Reviewed: 04/01/2014 Elsevier Interactive Patient Education  2017 [employer].

## 2020-04-29 ENCOUNTER — Other Ambulatory Visit: Payer: Self-pay | Admitting: Family Medicine

## 2020-04-29 DIAGNOSIS — E049 Nontoxic goiter, unspecified: Secondary | ICD-10-CM

## 2020-04-29 DIAGNOSIS — E785 Hyperlipidemia, unspecified: Secondary | ICD-10-CM

## 2020-05-02 ENCOUNTER — Ambulatory Visit: Payer: Medicare Other

## 2020-05-03 ENCOUNTER — Other Ambulatory Visit (INDEPENDENT_AMBULATORY_CARE_PROVIDER_SITE_OTHER): Payer: Medicare Other

## 2020-05-03 ENCOUNTER — Other Ambulatory Visit: Payer: Self-pay

## 2020-05-03 DIAGNOSIS — E785 Hyperlipidemia, unspecified: Secondary | ICD-10-CM

## 2020-05-03 DIAGNOSIS — E049 Nontoxic goiter, unspecified: Secondary | ICD-10-CM

## 2020-05-03 LAB — LIPID PANEL
Cholesterol: 148 mg/dL (ref 0–200)
HDL: 46.1 mg/dL (ref >39.00)
LDL Cholesterol: 86 mg/dL (ref 0–99)
NonHDL: 102.11
Total CHOL/HDL Ratio: 3
Triglycerides: 82 mg/dL (ref 0.0–149.0)
VLDL: 16.4 mg/dL (ref 0.0–40.0)

## 2020-05-03 LAB — COMPREHENSIVE METABOLIC PANEL
ALT: 15 U/L (ref 0–35)
AST: 17 U/L (ref 0–37)
Albumin: 4.3 g/dL (ref 3.5–5.2)
Alkaline Phosphatase: 77 U/L (ref 39–117)
BUN: 14 mg/dL (ref 6–23)
CO2: 31 mEq/L (ref 19–32)
Calcium: 9.6 mg/dL (ref 8.4–10.5)
Chloride: 106 mEq/L (ref 96–112)
Creatinine, Ser: 0.96 mg/dL (ref 0.40–1.20)
GFR: 58.32 mL/min — ABNORMAL LOW (ref >60.00)
Glucose, Bld: 89 mg/dL (ref 70–99)
Potassium: 4.4 mEq/L (ref 3.5–5.1)
Sodium: 144 mEq/L (ref 135–145)
Total Bilirubin: 0.4 mg/dL (ref 0.2–1.2)
Total Protein: 7.2 g/dL (ref 6.0–8.3)

## 2020-05-03 LAB — TSH: TSH: 2.54 u[IU]/mL (ref 0.35–4.50)

## 2020-05-08 ENCOUNTER — Other Ambulatory Visit: Payer: Self-pay

## 2020-05-08 ENCOUNTER — Encounter: Payer: Self-pay | Admitting: Family Medicine

## 2020-05-08 ENCOUNTER — Ambulatory Visit (INDEPENDENT_AMBULATORY_CARE_PROVIDER_SITE_OTHER): Payer: Medicare Other | Admitting: Family Medicine

## 2020-05-08 VITALS — BP 132/70 | HR 98 | Temp 98.0°F | Ht 61.5 in | Wt 171.6 lb

## 2020-05-08 DIAGNOSIS — E669 Obesity, unspecified: Secondary | ICD-10-CM | POA: Insufficient documentation

## 2020-05-08 DIAGNOSIS — Z7189 Other specified counseling: Secondary | ICD-10-CM | POA: Diagnosis not present

## 2020-05-08 DIAGNOSIS — B029 Zoster without complications: Secondary | ICD-10-CM | POA: Diagnosis not present

## 2020-05-08 DIAGNOSIS — E785 Hyperlipidemia, unspecified: Secondary | ICD-10-CM | POA: Diagnosis not present

## 2020-05-08 DIAGNOSIS — I700 Atherosclerosis of aorta: Secondary | ICD-10-CM

## 2020-05-08 MED ORDER — ATORVASTATIN CALCIUM 20 MG PO TABS: 20.0000 mg | ORAL_TABLET | Freq: Every day | ORAL | 3 refills | Status: DC

## 2020-05-08 NOTE — Assessment & Plan Note (Signed)
H/o this 06/2019. Discussed waiting 1 year prior to receiving shingrix vaccine.

## 2020-05-08 NOTE — Assessment & Plan Note (Signed)
Continue aspirin, statin.  

## 2020-05-08 NOTE — Assessment & Plan Note (Signed)
Chronic, stable on atorvastatin 20mg  - continue. The 10-year ASCVD risk score [name], et al., 2013) is: 11.8%   Values used to calculate the score:     Age: 75 years     Sex: Female     Is Non-Hispanic African American: Yes     Diabetic: No     Tobacco smoker: No     Systolic Blood Pressure: 353 mmHg     Is BP treated: No     HDL Cholesterol: 46.1 mg/dL     Total Cholesterol: 148 mg/dL

## 2020-05-08 NOTE — Progress Notes (Signed)
Patient ID: Valerie Paul, female    DOB: 10-09-1945, 75 y.o.   MRN: [id number]  This visit was conducted in person.  BP 132/70   Pulse 98   Temp 98 F (36.7 C) (Temporal)   Ht 5' 1.5" (1.562 m)   Wt 171 lb 9 oz (77.8 kg)   SpO2 98%   BMI 31.89 kg/m    CC: AMW f/u visit  Subjective:   HPI: Valerie Paul is a 75 y.o. female presenting on 05/08/2020 for Annual Exam (Prt 2. )   Saw health advisor last month for medicare wellness visit. Note reviewed.    No exam data present  Flowsheet Row Clinical Support from 04/07/2020 in [facility] [place]  PHQ-2 Total Score 0      Fall Risk  04/07/2020 12/22/2018 12/17/2017 11/15/2016 10/07/2016  Falls in the past year? 0 0 No No No  Comment - - - - Emmi Telephone Survey: data to providers prior to load  Number falls in past yr: 0 - - - -  Injury with Fall? 0 - - - -  Risk for fall due to : No Fall Risks Medication side effect - - -  Follow up Falls evaluation completed;Falls prevention discussed Falls evaluation completed;Falls prevention discussed - - -    Preventative: COLONOSCOPY 02/2018 - 4 HP polyps, consider rpt 10 yrs ([place]) Well woman exam - normal pap smear 07/2015. Always normal pap smears. Normal pelvic exam 2020. Age out of cervical cancer screening. Denies pelvic pain/pressure, vaginal bleeding, or skin changes.  Mammogram - Bir[place]16/2017 @ [facility]. Breast exams at home. Overdue for f/u - she willcall andschedule. # provided. Lung cancer screening: not eligible  DEXA 08/2015 WNL Flu shotyearly COVID vaccine Moderna 03/2019, 04/2019, booster 01/2020 Pneumovax -2014, prevnar 2015 shingrix - interested to check at pharmacy  Advanced directives - packet provided previously, still has at home. Would want husband to be HCPOA.Still working on this. Seat belt use discussed. Sunscreen use discussed. No changing moles on skin.  Ex smoker - 1/2 ppd for 40 yrs, quit 15 yrs ago. Husband quit too.  Alcohol - rare   Dentist yearly  Eye exam yearly  Bowel - no constipation/diarrhea  Bladder - no incontinence  G0P0  Lives with husband, no pets  Occupation: retired, was Engineer, mining  Edu: BS business administration  Activity: exercise class 3x/wk(silver sneakers) - has stopped, walking, enjoys line dancing  Diet: good water, some fruits/vegetables      Relevant past medical, surgical, family and social history reviewed and updated as indicated. Interim medical history since our last visit reviewed. Allergies and medications reviewed and updated. Outpatient Medications Prior to Visit  Medication Sig Dispense Refill  . aspirin 81 MG tablet Take 1 tablet (81 mg total) by mouth 2 (two) times a week. (Patient taking differently: Take 81 mg by mouth once a week.) 30 tablet   . cholecalciferol (VITAMIN D) 1000 UNITS tablet Take 1,000 Units by mouth daily.    Marland Kitchen GARLIC PO Take by mouth daily.    . methocarbamol (ROBAXIN) 500 MG tablet Take 1 tablet (500 mg total) by mouth 3 (three) times daily as needed for muscle spasms (sedation precautions). 40 tablet 0  . Multiple Vitamin (MULTIVITAMIN) tablet Take 1 tablet by mouth daily.    . vitamin C (ASCORBIC ACID) 500 MG tablet Take 500 mg by mouth daily.    Marland Kitchen atorvastatin (LIPITOR) 20 MG tablet TAKE 1 TABLET BY MOUTH EVERY DAY 90 tablet  0  . predniSONE (DELTASONE) 20 MG tablet Take two tablets daily for 3 days followed by one tablet daily for 4 days (Patient not taking: Reported on 04/07/2020) 10 tablet 0   No facility-administered medications prior to visit.     Per HPI unless specifically indicated in ROS section below Review of Systems Objective:  BP 132/70   Pulse 98   Temp 98 F (36.7 C) (Temporal)   Ht 5' 1.5" (1.562 m)   Wt 171 lb 9 oz (77.8 kg)   SpO2 98%   BMI 31.89 kg/m   Wt Readings from Last 3 Encounters:  05/08/20 171 lb 9 oz (77.8 kg)  12/01/19 167 lb 6 oz (75.9 kg)  06/10/19 171 lb 6 oz (77.7 kg)      Physical Exam Vitals  and nursing note reviewed.  Constitutional:      General: She is not in acute distress.    Appearance: Normal appearance. She is well-developed and well-nourished. She is not ill-appearing.  HENT:     Head: Normocephalic and atraumatic.     Right Ear: Hearing, tympanic membrane, ear canal and external ear normal.     Left Ear: Hearing, tympanic membrane, ear canal and external ear normal.     Mouth/Throat:     Mouth: Oropharynx is clear and moist and mucous membranes are normal.     Pharynx: No posterior oropharyngeal edema.  Eyes:     General: No scleral icterus.    Extraocular Movements: Extraocular movements intact and EOM normal.     Conjunctiva/sclera: Conjunctivae normal.     Pupils: Pupils are equal, round, and reactive to light.  Neck:     Thyroid: No thyroid mass or thyromegaly.     Vascular: No carotid bruit.  Cardiovascular:     Rate and Rhythm: Normal rate and regular rhythm.     Pulses: Normal pulses and intact distal pulses.          Radial pulses are 2+ on the right side and 2+ on the left side.     Heart sounds: Normal heart sounds. No murmur heard.   Pulmonary:     Effort: Pulmonary effort is normal. No respiratory distress.     Breath sounds: Normal breath sounds. No wheezing, rhonchi or rales.  Abdominal:     General: Abdomen is flat. Bowel sounds are normal. There is no distension.     Palpations: Abdomen is soft. There is no mass.     Tenderness: There is no abdominal tenderness. There is no guarding or rebound.     Hernia: No hernia is present.  Musculoskeletal:        General: No edema. Normal range of motion.     Cervical back: Normal range of motion and neck supple.     Right lower leg: No edema.     Left lower leg: No edema.  Lymphadenopathy:     Cervical: No cervical adenopathy.  Skin:    General: Skin is warm and dry.     Findings: No rash.  Neurological:     General: No focal deficit present.     Mental Status: She is alert and oriented to  person, place, and time.     Comments: CN grossly intact, station and gait intact  Psychiatric:        Mood and Affect: Mood and affect and mood normal.        Behavior: Behavior normal.        Thought Content: Thought content normal.  Judgment: Judgment normal.       Results for orders placed or performed in visit on 05/03/20  TSH  Result Value Ref Range   TSH 2.54 0.35 - 4.50 uIU/mL  Comprehensive metabolic panel  Result Value Ref Range   Sodium 144 135 - 145 mEq/L   Potassium 4.4 3.5 - 5.1 mEq/L   Chloride 106 96 - 112 mEq/L   CO2 31 19 - 32 mEq/L   Glucose, Bld 89 70 - 99 mg/dL   BUN 14 6 - 23 mg/dL   Creatinine, Ser 0.96 0.40 - 1.20 mg/dL   Total Bilirubin 0.4 0.2 - 1.2 mg/dL   Alkaline Phosphatase 77 39 - 117 U/L   AST 17 0 - 37 U/L   ALT 15 0 - 35 U/L   Total Protein 7.2 6.0 - 8.3 g/dL   Albumin 4.3 3.5 - 5.2 g/dL   GFR 58.32 (L) >60.00 mL/min   Calcium 9.6 8.4 - 10.5 mg/dL  Lipid panel  Result Value Ref Range   Cholesterol 148 0 - 200 mg/dL   Triglycerides 82.0 0.0 - 149.0 mg/dL   HDL 46.10 >39.00 mg/dL   VLDL 16.4 0.0 - 40.0 mg/dL   LDL Cholesterol 86 0 - 99 mg/dL   Total CHOL/HDL Ratio 3    NonHDL 102.11    Assessment & Plan:  This visit occurred during the SARS-CoV-2 public health emergency.  Safety protocols were in place, including screening questions prior to the visit, additional usage of staff PPE, and extensive cleaning of exam room while observing appropriate contact time as indicated for disinfecting solutions.   Preventative care reviewed, updated unless patient declined.  Problem List Items Addressed This Visit    Obesity, Class I, BMI 30-34.9    Encouraged healthy lifestyle choices.       HLD (hyperlipidemia)    Chronic, stable on atorvastatin 20mg  - continue. The 10-year ASCVD risk score [name], et al., 2013) is: 11.8%   Values used to calculate the score:     Age: 12 years     Sex: Female     Is Non-Hispanic African American:  Yes     Diabetic: No     Tobacco smoker: No     Systolic Blood Pressure: 403 mmHg     Is BP treated: No     HDL Cholesterol: 46.1 mg/dL     Total Cholesterol: 148 mg/dL       Relevant Medications   atorvastatin (LIPITOR) 20 MG tablet   Herpes zoster without complication    H/o this 06/2019. Discussed waiting 1 year prior to receiving shingrix vaccine.       Advanced care planning/counseling discussion - Primary    Advanced directives - packet provided previously, still has at home. Would want husband to be HCPOA.Still working on this.      Abdominal aortic atherosclerosis (HCC)    Continue aspirin, statin.       Relevant Medications   atorvastatin (LIPITOR) 20 MG tablet       Meds ordered this encounter  Medications  . atorvastatin (LIPITOR) 20 MG tablet    Sig: Take 1 tablet (20 mg total) by mouth daily.    Dispense:  90 tablet    Refill:  3   No orders of the defined types were placed in this encounter.   Patient instructions: Call to schedule mammogram at your convenience as you're due: [facility] in [place] [phone] If interested, check with pharmacy about new 2  shot shingles series (shingrix). Wait until after April (which would mark 1 year from when you got shingles).  Work on Scientist, physiological.  Restart silver sneaker program or regular walking routine.  Increase water intake by 1-2 glasses a day.  You are doing well today. Return as needed or in 1 year for next wellness visit.  Follow up plan: Return in about 1 year (around 05/08/2021), or if symptoms worsen or fail to improve.  Ria Bush, MD

## 2020-05-08 NOTE — Assessment & Plan Note (Signed)
Advanced directives - packet provided previously, still has at home. Would want husband to be HCPOA.Still working on this.

## 2020-05-08 NOTE — Assessment & Plan Note (Signed)
Encouraged healthy lifestyle choices.

## 2020-05-08 NOTE — Patient Instructions (Addendum)
Call to schedule mammogram at your convenience as you're due: [facility] in [place] [phone] If interested, check with pharmacy about new 2 shot shingles series (shingrix). Wait until after April (which would mark 1 year from when you got shingles).  Work on Scientist, physiological.  Restart silver sneaker program or regular walking routine.  Increase water intake by 1-2 glasses a day.  You are doing well today. Return as needed or in 1 year for next wellness visit.  Health Maintenance After Age 83 After age 53, you are at a higher risk for certain long-term diseases and infections as well as injuries from falls. Falls are a major cause of broken bones and head injuries in people who are older than age 58. Getting regular preventive care can help to keep you healthy and well. Preventive care includes getting regular testing and making lifestyle changes as recommended by your health care provider. Talk with your health care provider about:  Which screenings and tests you should have. A screening is a test that checks for a disease when you have no symptoms.  A diet and exercise plan that is right for you. What should I know about screenings and tests to prevent falls? Screening and testing are the best ways to find a health problem early. Early diagnosis and treatment give you the best chance of managing medical conditions that are common after age 53. Certain conditions and lifestyle choices may make you more likely to have a fall. Your health care provider may recommend:  Regular vision checks. Poor vision and conditions such as cataracts can make you more likely to have a fall. If you wear glasses, make sure to get your prescription updated if your vision changes.  Medicine review. Work with your health care provider to regularly review all of the medicines you are taking, including over-the-counter medicines. Ask your health care provider about any side effects that may make you more  likely to have a fall. Tell your health care provider if any medicines that you take make you feel dizzy or sleepy.  Osteoporosis screening. Osteoporosis is a condition that causes the bones to get weaker. This can make the bones weak and cause them to break more easily.  Blood pressure screening. Blood pressure changes and medicines to control blood pressure can make you feel dizzy.  Strength and balance checks. Your health care provider may recommend certain tests to check your strength and balance while standing, walking, or changing positions.  Foot health exam. Foot pain and numbness, as well as not wearing proper footwear, can make you more likely to have a fall.  Depression screening. You may be more likely to have a fall if you have a fear of falling, feel emotionally low, or feel unable to do activities that you used to do.  Alcohol use screening. Using too much alcohol can affect your balance and may make you more likely to have a fall. What actions can I take to lower my risk of falls? General instructions  Talk with your health care provider about your risks for falling. Tell your health care provider if: ? You fall. Be sure to tell your health care provider about all falls, even ones that seem minor. ? You feel dizzy, sleepy, or off-balance.  Take over-the-counter and prescription medicines only as told by your health care provider. These include any supplements.  Eat a healthy diet and maintain a healthy weight. A healthy diet includes low-fat dairy products, low-fat (lean) meats, and  fiber from whole grains, beans, and lots of fruits and vegetables. Home safety  Remove any tripping hazards, such as rugs, cords, and clutter.  Install safety equipment such as grab bars in bathrooms and safety rails on stairs.  Keep rooms and walkways well-lit. Activity  Follow a regular exercise program to stay fit. This will help you maintain your balance. Ask your health care provider  what types of exercise are appropriate for you.  If you need a cane or walker, use it as recommended by your health care provider.  Wear supportive shoes that have nonskid soles.   Lifestyle  Do not drink alcohol if your health care provider tells you not to drink.  If you drink alcohol, limit how much you have: ? 0-1 drink a day for women. ? 0-2 drinks a day for men.  Be aware of how much alcohol is in your drink. In the [place] one drink equals one typical bottle of beer (12 oz), one-half glass of wine (5 oz), or one shot of hard liquor (1 oz).  Do not use any products that contain nicotine or tobacco, such as cigarettes and e-cigarettes. If you need help quitting, ask your health care provider. Summary  Having a healthy lifestyle and getting preventive care can help to protect your health and wellness after age 71.  Screening and testing are the best way to find a health problem early and help you avoid having a fall. Early diagnosis and treatment give you the best chance for managing medical conditions that are more common for people who are older than age 74.  Falls are a major cause of broken bones and head injuries in people who are older than age 23. Take precautions to prevent a fall at home.  Work with your health care provider to learn what changes you can make to improve your health and wellness and to prevent falls. This information is not intended to replace advice given to you by your health care provider. Make sure you discuss any questions you have with your health care provider. Document Revised: 06/18/2018 Document Reviewed: 01/08/2017 Elsevier Patient Education  2021 [employer].

## 2020-06-19 ENCOUNTER — Ambulatory Visit: Payer: Medicare Other

## 2020-12-04 DIAGNOSIS — H2513 Age-related nuclear cataract, bilateral: Secondary | ICD-10-CM | POA: Diagnosis not present

## 2020-12-04 DIAGNOSIS — H43393 Other vitreous opacities, bilateral: Secondary | ICD-10-CM | POA: Diagnosis not present

## 2020-12-04 DIAGNOSIS — H25013 Cortical age-related cataract, bilateral: Secondary | ICD-10-CM | POA: Diagnosis not present

## 2020-12-04 DIAGNOSIS — H524 Presbyopia: Secondary | ICD-10-CM | POA: Diagnosis not present

## 2021-01-04 DIAGNOSIS — Z23 Encounter for immunization: Secondary | ICD-10-CM | POA: Diagnosis not present

## 2021-04-06 NOTE — Progress Notes (Signed)
Subjective:   Valerie Paul is a 76 y.o. female who presents for Medicare Annual (Subsequent) preventive examination.  I connected with Valerie Paul today by telephone and verified that I am speaking with the correct person using two identifiers. Location patient: home Location provider: work Persons participating in the virtual visit: patient, Marine scientist.    I discussed the limitations, risks, security and privacy concerns of performing an evaluation and management service by telephone and the availability of in person appointments. I also discussed with the patient that there may be a patient responsible charge related to this service. The patient expressed understanding and verbally consented to this telephonic visit.    Interactive audio and video telecommunications were attempted between this provider and patient, however failed, due to patient having technical difficulties OR patient did not have access to video capability.  We continued and completed visit with audio only.  Some vital signs may be absent or patient reported.   Time Spent with patient on telephone encounter: 22 minutes  Review of Systems     Cardiac Risk Factors include: advanced age (>60men, >51 women);dyslipidemia     Objective:    Today's Vitals   04/09/21 1359  Weight: 169 lb (76.7 kg)  Height: 5\' 1"  (1.549 m)   Body mass index is 31.93 kg/m.  Advanced Directives 04/09/2021 04/07/2020 12/22/2018 12/17/2017 11/15/2016 01/20/2016 06/30/2015  Does Patient Have a Medical Advance Directive? No No No No No No No  Does patient want to make changes to medical advance directive? - No - Patient declined - - - - -  Would patient like information on creating a medical advance directive? Yes (MAU/Ambulatory/Procedural Areas - Information given) - No - Patient declined No - Patient declined - No - patient declined information No - patient declined information    Current Medications (verified) Outpatient Encounter Medications  as of 04/09/2021  Medication Sig   aspirin 81 MG tablet Take 1 tablet (81 mg total) by mouth 2 (two) times a week. (Patient taking differently: Take 81 mg by mouth once a week.)   atorvastatin (LIPITOR) 20 MG tablet Take 1 tablet (20 mg total) by mouth daily.   cholecalciferol (VITAMIN D) 1000 UNITS tablet Take 1,000 Units by mouth daily.   GARLIC PO Take by mouth daily.   Multiple Vitamin (MULTIVITAMIN) tablet Take 1 tablet by mouth daily.   vitamin C (ASCORBIC ACID) 500 MG tablet Take 500 mg by mouth daily.   methocarbamol (ROBAXIN) 500 MG tablet Take 1 tablet (500 mg total) by mouth 3 (three) times daily as needed for muscle spasms (sedation precautions). (Patient not taking: Reported on 04/09/2021)   No facility-administered encounter medications on file as of 04/09/2021.    Allergies (verified) Patient has no known allergies.   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/p benign biopsy ([facility])   History of anemia    HLD (hyperlipidemia)    Infertility 1970s   tube blockage, G0P0   Seasonal allergies    pollen   Past Surgical History:  Procedure Laterality Date   APPENDECTOMY  1977   BIOPSY THYROID  2006   normal per pt ([facility])   COLONOSCOPY  02/2018   4 HP polyps, consider rpt 10 yrs ([facility])   DEXA  08/2015   WNL   DILATION AND CURETTAGE OF UTERUS  LAPAROSCOPY  1982   MYOMECTOMY  1977   fibroids   OVARIAN CYST REMOVAL     right   Family History  Problem Relation Age of Onset   Cancer Brother 57       lung (smoker)   Cancer Mother 68       brain   Heart failure Father    Hypertension Maternal Aunt    Learning disabilities Daughter    CAD Neg Hx    Stroke Neg Hx    Diabetes Neg Hx    Colon cancer Neg Hx    Esophageal cancer Neg Hx    Stomach cancer Neg Hx    Rectal cancer Neg Hx    Social History   Socioeconomic History   Marital status:  Married    Spouse name: Not on file   Number of children: Not on file   Years of education: Not on file   Highest education level: Not on file  Occupational History   Not on file  Tobacco Use   Smoking status: Former    Packs/day: 1.00    Types: Cigarettes    Start date: 03/12/1963    Quit date: 03/12/2003    Years since quitting: 18.0   Smokeless tobacco: Never  Vaping Use   Vaping Use: Never used  Substance and Sexual Activity   Alcohol use: Yes    Comment: Rare   Drug use: No   Sexual activity: Never  Other Topics Concern   Not on file  Social History Narrative   Lives with husband, no pets   Occupation: retired, was Engineer, mining   Edu: BS business administration   Activity: exercise class 3x/wk, walking    Diet: good water, some fruits/vegetables   Social Determinants of Radio broadcast assistant Strain: Low Risk    Difficulty of Paying Living Expenses: Not hard at all  Food Insecurity: No Food Insecurity   Worried About Charity fundraiser in the Last Year: Never true   Arboriculturist in the Last Year: Never true  Transportation Needs: No Transportation Needs   Lack of Transportation (Medical): No   Lack of Transportation (Non-Medical): No  Physical Activity: Sufficiently Active   Days of Exercise per Week: 3 days   Minutes of Exercise per Session: 50 min  Stress: No Stress Concern Present   Feeling of Stress : Not at all  Social Connections: Moderately Integrated   Frequency of Communication with Friends and Family: More than three times a week   Frequency of Social Gatherings with Friends and Family: Twice a week   Attends Religious Services: More than 4 times per year   Active Member of [employer] or Organizations: No   Attends Music therapist: Never   Marital Status: Married    Tobacco Counseling Counseling given: Not Answered   Clinical Intake:  Pre-visit preparation completed: Yes  Pain : No/denies pain     BMI - recorded:  31.93 Nutritional Status: BMI > 30  Obese Nutritional Risks: None Diabetes: No  How often do you need to have someone help you when you read instructions, pamphlets, or other written materials from your doctor or pharmacy?: 1 - Never  Diabetic?No Interpreter Needed?: No  Information entered by :: Valerie Brigham LPN   Activities of Daily Living In your present state of health, do you have any difficulty performing the following activities: 04/09/2021  Hearing? N  Vision? N  Difficulty concentrating or making decisions? N  Walking or climbing stairs? N  Dressing or bathing? N  Doing errands, shopping? N  Preparing Food and eating ? N  Using the Toilet? N  In the past six months, have you accidently leaked urine? N  Do you have problems with loss of bowel control? N  Managing your Medications? N  Managing your Finances? N  Housekeeping or managing your Housekeeping? N  Some recent data might be hidden    Patient Care Team: Ria Bush, MD as PCP - General (Family Medicine)  Indicate any recent Medical Services you may have received from other than [facility] providers in the past year (date may be approximate).     Assessment:   This is a routine wellness examination for Valerie Paul.  Hearing/Vision screen Hearing Screening - Comments:: No issues Vision Screening - Comments:: Last exam 11/2020, Dr. @ [facility], wears glasses for reading  Dietary issues and exercise activities discussed: Current Exercise Habits: Home exercise routine, Type of exercise: walking;Other - see comments (line dancing), Time (Minutes): 50, Frequency (Times/Week): 3, Weekly Exercise (Minutes/Week): 150, Intensity: Moderate   Goals Addressed             This Visit's Progress    Patient Stated       Would like to maintain current routine       Depression Screen PHQ 2/9 Scores 04/09/2021 04/07/2020 12/22/2018 12/17/2017 11/15/2016 06/30/2015 01/12/2014  PHQ - 2 Score 0 0 0 0 0 0 0  PHQ- 9 Score - 0  0 0 2 - -    Fall Risk Fall Risk  04/09/2021 04/07/2020 12/22/2018 12/17/2017 11/15/2016  Falls in the past year? 0 0 0 No No  Comment - - - - -  Number falls in past yr: 0 0 - - -  Injury with Fall? 0 0 - - -  Risk for fall due to : No Fall Risks No Fall Risks Medication side effect - -  Follow up Falls prevention discussed Falls evaluation completed;Falls prevention discussed Falls evaluation completed;Falls prevention discussed - -    FALL RISK PREVENTION PERTAINING TO THE HOME:  Any stairs in or around the home? Yes  If so, are there any without handrails? No  Home free of loose throw rugs in walkways, pet beds, electrical cords, etc? Yes  Adequate lighting in your home to reduce risk of falls? Yes   ASSISTIVE DEVICES UTILIZED TO PREVENT FALLS:  Life alert? No  Use of a cane, walker or w/c? No  Grab bars in the bathroom? No  Shower chair or bench in shower? No  Elevated toilet seat or a handicapped toilet? No   TIMED UP AND GO:  Was the test performed? No .    Cognitive Function: Normal cognitive status assessed by this Nurse Health Advisor. No abnormalities found.   MMSE - Mini Mental State Exam 04/07/2020 12/22/2018 12/17/2017 11/15/2016 06/30/2015  Orientation to time 5 5 5 5 5   Orientation to Place 5 5 5 5 5   Registration 3 3 3 3 3   Attention/ Calculation 5 5 0 0 0  Recall 3 3 3 3 3   Language- name 2 objects - - 0 0 0  Language- repeat 1 1 1 1 1   Language- follow 3 step command - - 3 3 3   Language- read & follow direction - - 0 0 0  Write a sentence - - 0 0 0  Copy design - - 0 0 0  Total score - - 20 20 20  Immunizations Immunization History  Administered Date(s) Administered   Fluad Quad(high Dose 65+) 12/29/2018, 12/01/2019   Influenza, High Dose Seasonal PF 01/04/2021   Influenza,inj,Quad PF,6+ Mos 01/08/2013, 02/06/2016, 12/17/2017   Influenza-Unspecified 12/01/2013   Moderna SARS-COV2 Booster Vaccination 01/18/2020   Moderna Sars-Covid-2  Vaccination 04/06/2019, 05/04/2019   Pneumococcal Conjugate-13 01/12/2014   Pneumococcal Polysaccharide-23 01/08/2013    TDAP status: Due, Education has been provided regarding the importance of this vaccine. Advised may receive this vaccine at local pharmacy or Health Dept. Aware to provide a copy of the vaccination record if obtained from local pharmacy or Health Dept. Verbalized acceptance and understanding.  Flu Vaccine status: Up to date  Pneumococcal vaccine status: Up to date  Covid-19 vaccine status: Information provided on how to obtain vaccines.   Qualifies for Shingles Vaccine? Yes   Zostavax completed No   Shingrix Completed?: No.    Education has been provided regarding the importance of this vaccine. Patient has been advised to call insurance company to determine out of pocket expense if they have not yet received this vaccine. Advised may also receive vaccine at local pharmacy or Health Dept. Verbalized acceptance and understanding.  Screening Tests Health Maintenance  Topic Date Due   Zoster Vaccines- Shingrix (1 of 2) Never done   COVID-19 Vaccine (3 - Moderna risk series) 02/15/2020   TETANUS/TDAP  06/30/2023 (Originally 12/03/1964)   COLONOSCOPY (Pts 45-11yrs Insurance coverage will need to be confirmed)  02/27/2028   Pneumonia Vaccine 41+ Years old  Completed   INFLUENZA VACCINE  Completed   DEXA SCAN  Completed   Hepatitis C Screening  Completed   HPV VACCINES  Aged Out    Health Maintenance  Health Maintenance Due  Topic Date Due   Zoster Vaccines- Shingrix (1 of 2) Never done   COVID-19 Vaccine (3 - Moderna risk series) 02/15/2020    Colorectal cancer screening: Type of screening: Colonoscopy. Completed 02/26/18. Repeat every 10 years  Mammogram status: Ordered 04/09/21. Pt provided with contact info and advised to call to schedule appt.   Bone Density status: Ordered 04/09/21. Pt provided with contact info and advised to call to schedule appt.  Lung  Cancer Screening: (Low Dose CT Chest recommended if Age 75-80 years, 30 pack-year currently smoking OR have quit w/in 15years.) does not qualify.     Additional Screening:  Hepatitis C Screening: does qualify; Completed 06/30/15  Vision Screening: Recommended annual ophthalmology exams for early detection of glaucoma and other disorders of the eye. Is the patient up to date with their annual eye exam?  Yes  Who is the provider or what is the name of the office in which the patient attends annual eye exams? [facility], provider information unavailable   Dental Screening: Recommended annual dental exams for proper oral hygiene  Community Resource Referral / Chronic Care Management: CRR required this visit?  No   CCM required this visit?  No      Plan:     I have personally reviewed and noted the following in the patients chart:   Medical and social history Use of alcohol, tobacco or illicit drugs  Current medications and supplements including opioid prescriptions.  Functional ability and status Nutritional status Physical activity Advanced directives List of other physicians Hospitalizations, surgeries, and ER visits in previous 12 months Vitals Screenings to include cognitive, depression, and falls Referrals and appointments  In addition, I have reviewed and discussed with patient certain preventive protocols, quality metrics, and best practice recommendations. A written  personalized care plan for preventive services as well as general preventive health recommendations were provided to patient.   Due to this being a telephonic visit, the after visit summary with patients personalized plan was offered to patient via mail or my-chart. Patient would like to access on my-chart.  Loma Messing, LPN   07/02/9530   Nurse Health Advisor  Nurse Notes: Unable to connect for video call, telephone visit was completed today.

## 2021-04-09 ENCOUNTER — Ambulatory Visit (INDEPENDENT_AMBULATORY_CARE_PROVIDER_SITE_OTHER): Payer: Medicare Other

## 2021-04-09 VITALS — Ht 61.0 in | Wt 169.0 lb

## 2021-04-09 DIAGNOSIS — Z780 Asymptomatic menopausal state: Secondary | ICD-10-CM

## 2021-04-09 DIAGNOSIS — Z0000 Encounter for general adult medical examination without abnormal findings: Secondary | ICD-10-CM | POA: Diagnosis not present

## 2021-04-09 DIAGNOSIS — Z1231 Encounter for screening mammogram for malignant neoplasm of breast: Secondary | ICD-10-CM

## 2021-04-09 NOTE — Patient Instructions (Signed)
Valerie Paul , Thank you for taking time to complete your Medicare Wellness Visit. I appreciate your ongoing commitment to your health goals. Please review the following plan we discussed and let me know if I can assist you in the future.   Screening recommendations/referrals: Colonoscopy: up to date, completed 02/26/18, due 02/27/28 Mammogram: due, last completed 08/11/15, ordered today, someone will call to schedule an appointment Bone Density: due, last completed 08/10/15, ordered today, someone will call to schedule an appointment Recommended yearly ophthalmology/optometry visit for glaucoma screening and checkup Recommended yearly dental visit for hygiene and checkup  Vaccinations: Influenza vaccine: up to date Pneumococcal vaccine: up to date Tdap vaccine: Due-May obtain vaccine at your local pharmacy. Shingles vaccine: -May obtain vaccine at your local pharmacy.   Covid-19: newest booster available at your local pharmacy  Advanced directives: Please bring a copy of Living Will and/or [place] for your chart. When available   Conditions/risks identified: see problem list  Next appointment: Follow up in one year for your annual wellness visit 04/10/22 @ 2:45pm, this will be a telephone visit   Preventive Care 76 Years and Older, Female Preventive care refers to lifestyle choices and visits with your health care provider that can promote health and wellness. What does preventive care include? A yearly physical exam. This is also called an annual well check. Dental exams once or twice a year. Routine eye exams. Ask your health care provider how often you should have your eyes checked. Personal lifestyle choices, including: Daily care of your teeth and gums. Regular physical activity. Eating a healthy diet. Avoiding tobacco and drug use. Limiting alcohol use. Practicing safe sex. Taking low-dose aspirin every day. Taking vitamin and mineral supplements as  recommended by your health care provider. What happens during an annual well check? The services and screenings done by your health care provider during your annual well check will depend on your age, overall health, lifestyle risk factors, and family history of disease. Counseling  Your health care provider may ask you questions about your: Alcohol use. Tobacco use. Drug use. Emotional well-being. Home and relationship well-being. Sexual activity. Eating habits. History of falls. Memory and ability to understand (cognition). Work and work Statistician. Reproductive health. Screening  You may have the following tests or measurements: Height, weight, and BMI. Blood pressure. Lipid and cholesterol levels. These may be checked every 5 years, or more frequently if you are over 69 years old. Skin check. Lung cancer screening. You may have this screening every year starting at age 76 if you have a 30-pack-year history of smoking and currently smoke or have quit within the past 15 years. Fecal occult blood test (FOBT) of the stool. You may have this test every year starting at age 76. Flexible sigmoidoscopy or colonoscopy. You may have a sigmoidoscopy every 5 years or a colonoscopy every 10 years starting at age 103. Hepatitis C blood test. Hepatitis B blood test. Sexually transmitted disease (STD) testing. Diabetes screening. This is done by checking your blood sugar (glucose) after you have not eaten for a while (fasting). You may have this done every 1-3 years. Bone density scan. This is done to screen for osteoporosis. You may have this done starting at age 76. Mammogram. This may be done every 1-2 years. Talk to your health care provider about how often you should have regular mammograms. Talk with your health care provider about your test results, treatment options, and if necessary, the need for more tests. Vaccines  Your health care provider may recommend certain vaccines, such  as: Influenza vaccine. This is recommended every year. Tetanus, diphtheria, and acellular pertussis (Tdap, Td) vaccine. You may need a Td booster every 10 years. Zoster vaccine. You may need this after age 59. Pneumococcal 13-valent conjugate (PCV13) vaccine. One dose is recommended after age 64. Pneumococcal polysaccharide (PPSV23) vaccine. One dose is recommended after age 79. Talk to your health care provider about which screenings and vaccines you need and how often you need them. This information is not intended to replace advice given to you by your health care provider. Make sure you discuss any questions you have with your health care provider. Document Released: 03/24/2015 Document Revised: 11/15/2015 Document Reviewed: 12/27/2014 Elsevier Interactive Patient Education  2017 [employer] Prevention in the Home Falls can cause injuries. They can happen to people of all ages. There are many things you can do to make your home safe and to help prevent falls. What can I do on the outside of my home? Regularly fix the edges of walkways and driveways and fix any cracks. Remove anything that might make you trip as you walk through a door, such as a raised step or threshold. Trim any bushes or trees on the path to your home. Use bright outdoor lighting. Clear any walking paths of anything that might make someone trip, such as rocks or tools. Regularly check to see if handrails are loose or broken. Make sure that both sides of any steps have handrails. Any raised decks and porches should have guardrails on the edges. Have any leaves, snow, or ice cleared regularly. Use sand or salt on walking paths during winter. Clean up any spills in your garage right away. This includes oil or grease spills. What can I do in the bathroom? Use night lights. Install grab bars by the toilet and in the tub and shower. Do not use towel bars as grab bars. Use non-skid mats or decals in the tub or  shower. If you need to sit down in the shower, use a plastic, non-slip stool. Keep the floor dry. Clean up any water that spills on the floor as soon as it happens. Remove soap buildup in the tub or shower regularly. Attach bath mats securely with double-sided non-slip rug tape. Do not have throw rugs and other things on the floor that can make you trip. What can I do in the bedroom? Use night lights. Make sure that you have a light by your bed that is easy to reach. Do not use any sheets or blankets that are too big for your bed. They should not hang down onto the floor. Have a firm chair that has side arms. You can use this for support while you get dressed. Do not have throw rugs and other things on the floor that can make you trip. What can I do in the kitchen? Clean up any spills right away. Avoid walking on wet floors. Keep items that you use a lot in easy-to-reach places. If you need to reach something above you, use a strong step stool that has a grab bar. Keep electrical cords out of the way. Do not use floor polish or wax that makes floors slippery. If you must use wax, use non-skid floor wax. Do not have throw rugs and other things on the floor that can make you trip. What can I do with my stairs? Do not leave any items on the stairs. Make sure that there are  handrails on both sides of the stairs and use them. Fix handrails that are broken or loose. Make sure that handrails are as long as the stairways. Check any carpeting to make sure that it is firmly attached to the stairs. Fix any carpet that is loose or worn. Avoid having throw rugs at the top or bottom of the stairs. If you do have throw rugs, attach them to the floor with carpet tape. Make sure that you have a light switch at the top of the stairs and the bottom of the stairs. If you do not have them, ask someone to add them for you. What else can I do to help prevent falls? Wear shoes that: Do not have high heels. Have  rubber bottoms. Are comfortable and fit you well. Are closed at the toe. Do not wear sandals. If you use a stepladder: Make sure that it is fully opened. Do not climb a closed stepladder. Make sure that both sides of the stepladder are locked into place. Ask someone to hold it for you, if possible. Clearly mark and make sure that you can see: Any grab bars or handrails. First and last steps. Where the edge of each step is. Use tools that help you move around (mobility aids) if they are needed. These include: Canes. Walkers. Scooters. Crutches. Turn on the lights when you go into a dark area. Replace any light bulbs as soon as they burn out. Set up your furniture so you have a clear path. Avoid moving your furniture around. If any of your floors are uneven, fix them. If there are any pets around you, be aware of where they are. Review your medicines with your doctor. Some medicines can make you feel dizzy. This can increase your chance of falling. Ask your doctor what other things that you can do to help prevent falls. This information is not intended to replace advice given to you by your health care provider. Make sure you discuss any questions you have with your health care provider. Document Released: 12/22/2008 Document Revised: 08/03/2015 Document Reviewed: 04/01/2014 Elsevier Interactive Patient Education  2017 [employer].

## 2021-05-02 ENCOUNTER — Other Ambulatory Visit: Payer: Self-pay | Admitting: Family Medicine

## 2021-07-18 ENCOUNTER — Ambulatory Visit
Admission: RE | Admit: 2021-07-18 | Discharge: 2021-07-18 | Disposition: A | Discharge status: Home / Self Care | Payer: Medicare Other | Source: Ambulatory Visit | Attending: Family Medicine | Admitting: Family Medicine

## 2021-07-18 DIAGNOSIS — Z1231 Encounter for screening mammogram for malignant neoplasm of breast: Secondary | ICD-10-CM

## 2021-07-18 DIAGNOSIS — Z780 Asymptomatic menopausal state: Secondary | ICD-10-CM | POA: Diagnosis not present

## 2021-07-18 DIAGNOSIS — M85851 Other specified disorders of bone density and structure, right thigh: Secondary | ICD-10-CM | POA: Diagnosis not present

## 2021-07-20 ENCOUNTER — Other Ambulatory Visit: Payer: Self-pay | Admitting: Family Medicine

## 2021-07-20 DIAGNOSIS — R928 Other abnormal and inconclusive findings on diagnostic imaging of breast: Secondary | ICD-10-CM

## 2021-07-21 ENCOUNTER — Encounter: Payer: Self-pay | Admitting: Family Medicine

## 2021-07-21 DIAGNOSIS — M8580 Other specified disorders of bone density and structure, unspecified site: Secondary | ICD-10-CM | POA: Insufficient documentation

## 2021-07-28 ENCOUNTER — Ambulatory Visit: Payer: Medicare Other

## 2021-07-28 ENCOUNTER — Other Ambulatory Visit: Payer: Self-pay | Admitting: Family Medicine

## 2021-07-28 ENCOUNTER — Ambulatory Visit
Admission: RE | Admit: 2021-07-28 | Discharge: 2021-07-28 | Disposition: A | Discharge status: Home / Self Care | Payer: Medicare Other | Source: Ambulatory Visit | Attending: Family Medicine | Admitting: Family Medicine

## 2021-07-28 DIAGNOSIS — R928 Other abnormal and inconclusive findings on diagnostic imaging of breast: Secondary | ICD-10-CM | POA: Diagnosis not present

## 2021-07-28 DIAGNOSIS — M85851 Other specified disorders of bone density and structure, right thigh: Secondary | ICD-10-CM

## 2021-07-28 DIAGNOSIS — E049 Nontoxic goiter, unspecified: Secondary | ICD-10-CM

## 2021-07-28 DIAGNOSIS — E785 Hyperlipidemia, unspecified: Secondary | ICD-10-CM

## 2021-07-31 ENCOUNTER — Other Ambulatory Visit (INDEPENDENT_AMBULATORY_CARE_PROVIDER_SITE_OTHER): Payer: Medicare Other

## 2021-07-31 DIAGNOSIS — E785 Hyperlipidemia, unspecified: Secondary | ICD-10-CM | POA: Diagnosis not present

## 2021-07-31 DIAGNOSIS — E049 Nontoxic goiter, unspecified: Secondary | ICD-10-CM | POA: Diagnosis not present

## 2021-07-31 DIAGNOSIS — M85851 Other specified disorders of bone density and structure, right thigh: Secondary | ICD-10-CM | POA: Diagnosis not present

## 2021-07-31 LAB — COMPREHENSIVE METABOLIC PANEL
ALT: 18 U/L (ref 0–35)
AST: 17 U/L (ref 0–37)
Albumin: 4.2 g/dL (ref 3.5–5.2)
Alkaline Phosphatase: 67 U/L (ref 39–117)
BUN: 12 mg/dL (ref 6–23)
CO2: 31 mEq/L (ref 19–32)
Calcium: 9.7 mg/dL (ref 8.4–10.5)
Chloride: 104 mEq/L (ref 96–112)
Creatinine, Ser: 1.04 mg/dL (ref 0.40–1.20)
GFR: 52.52 mL/min — ABNORMAL LOW (ref >60.00)
Glucose, Bld: 90 mg/dL (ref 70–99)
Potassium: 4.5 mEq/L (ref 3.5–5.1)
Sodium: 141 mEq/L (ref 135–145)
Total Bilirubin: 0.5 mg/dL (ref 0.2–1.2)
Total Protein: 7 g/dL (ref 6.0–8.3)

## 2021-07-31 LAB — LIPID PANEL
Cholesterol: 153 mg/dL (ref 0–200)
HDL: 42.9 mg/dL (ref >39.00)
LDL Cholesterol: 86 mg/dL (ref 0–99)
NonHDL: 109.81
Total CHOL/HDL Ratio: 4
Triglycerides: 117 mg/dL (ref 0.0–149.0)
VLDL: 23.4 mg/dL (ref 0.0–40.0)

## 2021-07-31 LAB — VITAMIN D 25 HYDROXY (VIT D DEFICIENCY, FRACTURES): VITD: 13.43 ng/mL — ABNORMAL LOW (ref 30.00–100.00)

## 2021-07-31 LAB — TSH: TSH: 2.12 u[IU]/mL (ref 0.35–5.50)

## 2021-08-02 ENCOUNTER — Other Ambulatory Visit: Payer: Self-pay | Admitting: Family Medicine

## 2021-08-07 ENCOUNTER — Ambulatory Visit (INDEPENDENT_AMBULATORY_CARE_PROVIDER_SITE_OTHER): Payer: Medicare Other | Admitting: Family Medicine

## 2021-08-07 ENCOUNTER — Encounter: Payer: Self-pay | Admitting: Family Medicine

## 2021-08-07 VITALS — BP 124/74 | HR 91 | Temp 97.4°F | Ht 61.0 in | Wt 168.2 lb

## 2021-08-07 DIAGNOSIS — I700 Atherosclerosis of aorta: Secondary | ICD-10-CM | POA: Diagnosis not present

## 2021-08-07 DIAGNOSIS — E66811 Obesity, class 1: Secondary | ICD-10-CM

## 2021-08-07 DIAGNOSIS — M85851 Other specified disorders of bone density and structure, right thigh: Secondary | ICD-10-CM | POA: Diagnosis not present

## 2021-08-07 DIAGNOSIS — N1831 Chronic kidney disease, stage 3a: Secondary | ICD-10-CM | POA: Diagnosis not present

## 2021-08-07 DIAGNOSIS — E669 Obesity, unspecified: Secondary | ICD-10-CM | POA: Diagnosis not present

## 2021-08-07 DIAGNOSIS — E785 Hyperlipidemia, unspecified: Secondary | ICD-10-CM | POA: Diagnosis not present

## 2021-08-07 DIAGNOSIS — E559 Vitamin D deficiency, unspecified: Secondary | ICD-10-CM | POA: Diagnosis not present

## 2021-08-07 DIAGNOSIS — N183 Chronic kidney disease, stage 3 unspecified: Secondary | ICD-10-CM | POA: Insufficient documentation

## 2021-08-07 MED ORDER — VITAMIN D3 50 MCG (2000 UT) PO CAPS: 2000.0000 [IU] | ORAL_CAPSULE | Freq: Every day | ORAL | Status: AC

## 2021-08-07 MED ORDER — ATORVASTATIN CALCIUM 20 MG PO TABS: 20.0000 mg | ORAL_TABLET | Freq: Every day | ORAL | 3 refills | Status: DC

## 2021-08-07 NOTE — Patient Instructions (Addendum)
If interested, check with pharmacy about new 2 shot shingles series (shingrix).  Ensure good water intake for kidneys.  Take vitamin D 2000 units daily over the counter. Return in 4 months for lab visit only to recheck kidney function.  Return after 04/09/2022 for next medicare wellness visit   Health Maintenance After Age 76 After age 51, you are at a higher risk for certain long-term diseases and infections as well as injuries from falls. Falls are a major cause of broken bones and head injuries in people who are older than age 76. Getting regular preventive care can help to keep you healthy and well. Preventive care includes getting regular testing and making lifestyle changes as recommended by your health care provider. Talk with your health care provider about: Which screenings and tests you should have. A screening is a test that checks for a disease when you have no symptoms. A diet and exercise plan that is right for you. What should I know about screenings and tests to prevent falls? Screening and testing are the best ways to find a health problem early. Early diagnosis and treatment give you the best chance of managing medical conditions that are common after age 76. Certain conditions and lifestyle choices may make you more likely to have a fall. Your health care provider may recommend: Regular vision checks. Poor vision and conditions such as cataracts can make you more likely to have a fall. If you wear glasses, make sure to get your prescription updated if your vision changes. Medicine review. Work with your health care provider to regularly review all of the medicines you are taking, including over-the-counter medicines. Ask your health care provider about any side effects that may make you more likely to have a fall. Tell your health care provider if any medicines that you take make you feel dizzy or sleepy. Strength and balance checks. Your health care provider may recommend certain  tests to check your strength and balance while standing, walking, or changing positions. Foot health exam. Foot pain and numbness, as well as not wearing proper footwear, can make you more likely to have a fall. Screenings, including: Osteoporosis screening. Osteoporosis is a condition that causes the bones to get weaker and break more easily. Blood pressure screening. Blood pressure changes and medicines to control blood pressure can make you feel dizzy. Depression screening. You may be more likely to have a fall if you have a fear of falling, feel depressed, or feel unable to do activities that you used to do. Alcohol use screening. Using too much alcohol can affect your balance and may make you more likely to have a fall. Follow these instructions at home: Lifestyle Do not drink alcohol if: Your health care provider tells you not to drink. If you drink alcohol: Limit how much you have to: 0-1 drink a day for women. 0-2 drinks a day for men. Know how much alcohol is in your drink. In the [place] one drink equals one 12 oz bottle of beer (355 mL), one 5 oz glass of wine (148 mL), or one 1 oz glass of hard liquor (44 mL). Do not use any products that contain nicotine or tobacco. These products include cigarettes, chewing tobacco, and vaping devices, such as e-cigarettes. If you need help quitting, ask your health care provider. Activity  Follow a regular exercise program to stay fit. This will help you maintain your balance. Ask your health care provider what types of exercise are appropriate for you. If  you need a cane or walker, use it as recommended by your health care provider. Wear supportive shoes that have nonskid soles. Safety  Remove any tripping hazards, such as rugs, cords, and clutter. Install safety equipment such as grab bars in bathrooms and safety rails on stairs. Keep rooms and walkways well-lit. General instructions Talk with your health care provider about your risks for  falling. Tell your health care provider if: You fall. Be sure to tell your health care provider about all falls, even ones that seem minor. You feel dizzy, tiredness (fatigue), or off-balance. Take over-the-counter and prescription medicines only as told by your health care provider. These include supplements. Eat a healthy diet and maintain a healthy weight. A healthy diet includes low-fat dairy products, low-fat (lean) meats, and fiber from whole grains, beans, and lots of fruits and vegetables. Stay current with your vaccines. Schedule regular health, dental, and eye exams. Summary Having a healthy lifestyle and getting preventive care can help to protect your health and wellness after age 107. Screening and testing are the best way to find a health problem early and help you avoid having a fall. Early diagnosis and treatment give you the best chance for managing medical conditions that are more common for people who are older than age 71. Falls are a major cause of broken bones and head injuries in people who are older than age 26. Take precautions to prevent a fall at home. Work with your health care provider to learn what changes you can make to improve your health and wellness and to prevent falls. This information is not intended to replace advice given to you by your health care provider. Make sure you discuss any questions you have with your health care provider. Document Revised: 07/17/2020 Document Reviewed: 07/17/2020 Elsevier Patient Education  [place].

## 2021-08-07 NOTE — Assessment & Plan Note (Signed)
Reviewed recent DEXA scan, encouraged good calcium in diet, start vit D 2000 IU daily, regular weight bearing exercise.

## 2021-08-07 NOTE — Assessment & Plan Note (Signed)
Continue aspirin, statin.  

## 2021-08-07 NOTE — Assessment & Plan Note (Signed)
Discussed daily OTC 2000 IU vs weekly Rx 50k units - she opts for daily OTC dose.

## 2021-08-07 NOTE — Assessment & Plan Note (Signed)
Persistent over the past year. Discussed good hydration status and avoidance of nephrotoxic agents. I have asked her to return in 4 months for rpt labwork to follow this.

## 2021-08-07 NOTE — Assessment & Plan Note (Signed)
Encouraged healthy diet and lifestyle choices to affect sustainable weight loss.  ?

## 2021-08-07 NOTE — Assessment & Plan Note (Signed)
Chronic, stable period on atorvastatin '20mg'$  daily - continue this. The 10-year ASCVD risk score (Arnett DK, et al., 2019) is: 11.5%   Values used to calculate the score:     Age: 76 years     Sex: Female     Is Non-Hispanic African American: Yes     Diabetic: No     Tobacco smoker: No     Systolic Blood Pressure: 423 mmHg     Is BP treated: No     HDL Cholesterol: 42.9 mg/dL     Total Cholesterol: 153 mg/dL

## 2021-08-07 NOTE — Progress Notes (Signed)
Patient ID: Valerie Paul, female    DOB: 1946/01/23, 76 y.o.   MRN: [id number]  This visit was conducted in person.  BP 124/74 (BP Location: Right Arm, Cuff Size: Normal)   Pulse 91   Temp (!) 97.4 F (36.3 C) (Temporal)   Ht '5\' 1"'$  (1.549 m)   Wt 168 lb 4 oz (76.3 kg)   SpO2 96%   BMI 31.79 kg/m   BP Readings from Last 3 Encounters:  08/07/21 124/74  05/08/20 132/70  12/01/19 120/74   CC: AM[facility] f/u visit  Subjective:   HPI: Valerie Paul is a 76 y.o. female presenting on 08/07/2021 for Annual Exam [facility] prt 2. )   Saw health advisor 03/2021 for medicare wellness visit. Note reviewed.    No results found.  Flowsheet Row Clinical Support from 04/09/2021 in [place] [facility] [place]  PHQ-2 Total Score 0          04/09/2021    2:05 PM 04/07/2020    2:01 PM 12/22/2018   12:08 PM 12/17/2017   12:56 PM 11/15/2016   11:02 AM  Fall Risk   Falls in the past year? 0 0 0 No No  Number falls in past yr: 0 0     Injury with Fall? 0 0     Risk for fall due to : No Fall Risks No Fall Risks Medication side effect    Follow up Falls prevention discussed Falls evaluation completed;Falls prevention discussed Falls evaluation completed;Falls prevention discussed        Preventative: COLONOSCOPY 02/2018 - 4 HP polyps, consider rpt 10 yrs ([place]) Well woman exam - normal pap smear 07/2015. Always normal pap smears. Normal pelvic exam 2020. Age out of cervical cancer screening. Denies pelvic pain/pressure, vaginal bleeding, or skin changes.  Mammogram - Birads1 07/2021 @ [facility]. Breast exams at home.  DEXA 07/2021 - T -1.4 R femur neck, not at increased fracture risk. Rec good calcium in diet, vit D 1000 IU daily, regular walking.  Lung cancer screening - not eligible  Flu shot yearly COVID vaccine Moderna 03/2019, 04/2019, booster 01/2020 Pneumovax - 2014, NATFTDD-22 2015 Shingrix - to check with pharmacy  Advanced directives - packet provided previously, still has at home. Would  want husband to be HCPOA. Still working on this.  Seat belt use discussed. Sunscreen use discussed. No changing moles on skin.  Ex smoker - 1/2 ppd for 40 yrs, quit >15 yrs ago. Husband restarted smoking.  Alcohol - rare  Dentist - due Eye exam yearly  Bowel - no constipation Bladder - no incontinence   G0P0  Lives with husband, no pets   Occupation: retired, was Engineer, mining   Edu: BS business administration   Activity: exercise class 3x/wk (silver sneakers) - has stopped, walking, enjoys line dancing  Diet: good water, some fruits/vegetables       Relevant past medical, surgical, family and social history reviewed and updated as indicated. Interim medical history since our last visit reviewed. Allergies and medications reviewed and updated. Outpatient Medications Prior to Visit  Medication Sig Dispense Refill   aspirin 81 MG tablet Take 1 tablet (81 mg total) by mouth 2 (two) times a week. (Patient taking differently: Take 81 mg by mouth once a week.) 30 tablet    GARLIC PO Take by mouth daily.     Multiple Vitamin (MULTIVITAMIN) tablet Take 1 tablet by mouth daily.     vitamin C (ASCORBIC ACID) 500 MG tablet Take  500 mg by mouth daily.     atorvastatin (LIPITOR) 20 MG tablet TAKE 1 TABLET BY MOUTH EVERY DAY 90 tablet 0   cholecalciferol (VITAMIN D) 1000 UNITS tablet Take 1,000 Units by mouth daily.     methocarbamol (ROBAXIN) 500 MG tablet Take 1 tablet (500 mg total) by mouth 3 (three) times daily as needed for muscle spasms (sedation precautions). 40 tablet 0   No facility-administered medications prior to visit.     Per HPI unless specifically indicated in ROS section below Review of Systems  Objective:  BP 124/74 (BP Location: Right Arm, Cuff Size: Normal)   Pulse 91   Temp (!) 97.4 F (36.3 C) (Temporal)   Ht '5\' 1"'$  (1.549 m)   Wt 168 lb 4 oz (76.3 kg)   SpO2 96%   BMI 31.79 kg/m   Wt Readings from Last 3 Encounters:  08/07/21 168 lb 4 oz (76.3 kg)   04/09/21 169 lb (76.7 kg)  05/08/20 171 lb 9 oz (77.8 kg)      Physical Exam Vitals and nursing note reviewed.  Constitutional:      Appearance: Normal appearance. She is not ill-appearing.  HENT:     Head: Normocephalic and atraumatic.     Right Ear: Tympanic membrane, ear canal and external ear normal. There is no impacted cerumen.     Left Ear: Tympanic membrane, ear canal and external ear normal. There is no impacted cerumen.     Mouth/Throat:     Mouth: Mucous membranes are moist.     Pharynx: Oropharynx is clear. No oropharyngeal exudate or posterior oropharyngeal erythema.  Eyes:     General:        Right eye: No discharge.        Left eye: No discharge.     Extraocular Movements: Extraocular movements intact.     Conjunctiva/sclera: Conjunctivae normal.     Pupils: Pupils are equal, round, and reactive to light.  Neck:     Thyroid: No thyroid mass or thyromegaly.     Vascular: No carotid bruit.  Cardiovascular:     Rate and Rhythm: Normal rate and regular rhythm.     Pulses: Normal pulses.     Heart sounds: Normal heart sounds. No murmur heard. Pulmonary:     Effort: Pulmonary effort is normal. No respiratory distress.     Breath sounds: Normal breath sounds. No wheezing, rhonchi or rales.  Abdominal:     General: Bowel sounds are normal. There is no distension.     Palpations: Abdomen is soft. There is no mass.     Tenderness: There is no abdominal tenderness. There is no guarding or rebound.     Hernia: No hernia is present.  Musculoskeletal:     Cervical back: Normal range of motion and neck supple. No rigidity.     Right lower leg: No edema.     Left lower leg: No edema.  Lymphadenopathy:     Cervical: No cervical adenopathy.  Skin:    General: Skin is warm and dry.     Findings: No rash.     Comments: No vesicular rash  Neurological:     General: No focal deficit present.     Mental Status: She is alert. Mental status is at baseline.  Psychiatric:         Mood and Affect: Mood normal.        Behavior: Behavior normal.      Results for orders placed or performed in  visit on 07/31/21  TSH  Result Value Ref Range   TSH 2.12 0.35 - 5.50 uIU/mL  Comprehensive metabolic panel  Result Value Ref Range   Sodium 141 135 - 145 mEq/L   Potassium 4.5 3.5 - 5.1 mEq/L   Chloride 104 96 - 112 mEq/L   CO2 31 19 - 32 mEq/L   Glucose, Bld 90 70 - 99 mg/dL   BUN 12 6 - 23 mg/dL   Creatinine, Ser 1.04 0.40 - 1.20 mg/dL   Total Bilirubin 0.5 0.2 - 1.2 mg/dL   Alkaline Phosphatase 67 39 - 117 U/L   AST 17 0 - 37 U/L   ALT 18 0 - 35 U/L   Total Protein 7.0 6.0 - 8.3 g/dL   Albumin 4.2 3.5 - 5.2 g/dL   GFR 52.52 (L) >60.00 mL/min   Calcium 9.7 8.4 - 10.5 mg/dL  Lipid panel  Result Value Ref Range   Cholesterol 153 0 - 200 mg/dL   Triglycerides 117.0 0.0 - 149.0 mg/dL   HDL 42.90 >39.00 mg/dL   VLDL 23.4 0.0 - 40.0 mg/dL   LDL Cholesterol 86 0 - 99 mg/dL   Total CHOL/HDL Ratio 4    NonHDL 109.81   VITAMIN D 25 Hydroxy (Vit-D Deficiency, Fractures)  Result Value Ref Range   VITD 13.43 (L) 30.00 - 100.00 ng/mL    Assessment & Plan:   Problem List Items Addressed This Visit     HLD (hyperlipidemia) - Primary    Chronic, stable period on atorvastatin '20mg'$  daily - continue this. The 10-year ASCVD risk score (Arnett DK, et al., 2019) is: 11.5%   Values used to calculate the score:     Age: 85 years     Sex: Female     Is Non-Hispanic African American: Yes     Diabetic: No     Tobacco smoker: No     Systolic Blood Pressure: 371 mmHg     Is BP treated: No     HDL Cholesterol: 42.9 mg/dL     Total Cholesterol: 153 mg/dL        Relevant Medications   atorvastatin (LIPITOR) 20 MG tablet   Abdominal aortic atherosclerosis (HCC)    Continue aspirin, statin.        Relevant Medications   atorvastatin (LIPITOR) 20 MG tablet   Obesity, Class I, BMI 30-34.9    Encouraged healthy diet and lifestyle choices to affect sustainable weight  loss.        Osteopenia    Reviewed recent DEXA scan, encouraged good calcium in diet, start vit D 2000 IU daily, regular weight bearing exercise.        Vitamin D deficiency    Discussed daily OTC 2000 IU vs weekly Rx 50k units - she opts for daily OTC dose.        CKD (chronic kidney disease) stage 3, GFR 30-59 ml/min (HCC)    Persistent over the past year. Discussed good hydration status and avoidance of nephrotoxic agents. I have asked her to return in 4 months for rpt labwork to follow this.        Relevant Orders   Renal function panel   VITAMIN D 25 Hydroxy (Vit-D Deficiency, Fractures)   CBC with Differential/Platelet   Parathyroid hormone, intact (no Ca)     Meds ordered this encounter  Medications   atorvastatin (LIPITOR) 20 MG tablet    Sig: Take 1 tablet (20 mg total) by mouth daily.    Dispense:  90 tablet    Refill:  3   Cholecalciferol (VITAMIN D3) 50 MCG (2000 UT) capsule    Sig: Take 1 capsule (2,000 Units total) by mouth daily.   Orders Placed This Encounter  Procedures   Renal function panel    Standing Status:   Future    Standing Expiration Date:   08/08/2022   VITAMIN D 25 Hydroxy (Vit-D Deficiency, Fractures)    Standing Status:   Future    Standing Expiration Date:   08/08/2022   CBC with Differential/Platelet    Standing Status:   Future    Standing Expiration Date:   08/08/2022   Parathyroid hormone, intact (no Ca)    Standing Status:   Future    Standing Expiration Date:   08/08/2022     Patient instructions: If interested, check with pharmacy about new 2 shot shingles series (shingrix).  Ensure good water intake for kidneys.  Take vitamin D 2000 units daily over the counter. Return in 4 months for lab visit only to recheck kidney function.  Return after 04/09/2022 for next medicare wellness visit   Follow up plan: Return in about 8 months (around 04/09/2022) for medicare wellness visit, follow up visit.  Ria Bush, MD

## 2021-12-10 ENCOUNTER — Other Ambulatory Visit (INDEPENDENT_AMBULATORY_CARE_PROVIDER_SITE_OTHER): Payer: Medicare Other

## 2021-12-10 DIAGNOSIS — N1831 Chronic kidney disease, stage 3a: Secondary | ICD-10-CM

## 2021-12-10 LAB — CBC WITH DIFFERENTIAL/PLATELET
Basophils Absolute: 0.1 10*3/uL (ref 0.0–0.1)
Basophils Relative: 1.2 % (ref 0.0–3.0)
Eosinophils Absolute: 0.2 10*3/uL (ref 0.0–0.7)
Eosinophils Relative: 3.7 % (ref 0.0–5.0)
HCT: 42 % (ref 36.0–46.0)
Hemoglobin: 14.3 g/dL (ref 12.0–15.0)
Lymphocytes Relative: 42.8 % (ref 12.0–46.0)
Lymphs Abs: 1.9 10*3/uL (ref 0.7–4.0)
MCHC: 34.1 g/dL (ref 30.0–36.0)
MCV: 88.7 fl (ref 78.0–100.0)
Monocytes Absolute: 0.4 10*3/uL (ref 0.1–1.0)
Monocytes Relative: 8.1 % (ref 3.0–12.0)
Neutro Abs: 1.9 10*3/uL (ref 1.4–7.7)
Neutrophils Relative %: 44.2 % (ref 43.0–77.0)
Platelets: 377 10*3/uL (ref 150.0–400.0)
RBC: 4.73 Mil/uL (ref 3.87–5.11)
RDW: 13.9 % (ref 11.5–15.5)
WBC: 4.4 10*3/uL (ref 4.0–10.5)

## 2021-12-10 LAB — VITAMIN D 25 HYDROXY (VIT D DEFICIENCY, FRACTURES): VITD: 32.56 ng/mL (ref 30.00–100.00)

## 2021-12-10 LAB — RENAL FUNCTION PANEL
Albumin: 4.4 g/dL (ref 3.5–5.2)
BUN: 15 mg/dL (ref 6–23)
CO2: 28 mEq/L (ref 19–32)
Calcium: 9.7 mg/dL (ref 8.4–10.5)
Chloride: 102 mEq/L (ref 96–112)
Creatinine, Ser: 1.07 mg/dL (ref 0.40–1.20)
GFR: 50.63 mL/min — ABNORMAL LOW (ref >60.00)
Glucose, Bld: 78 mg/dL (ref 70–99)
Phosphorus: 3.4 mg/dL (ref 2.3–4.6)
Potassium: 4.6 mEq/L (ref 3.5–5.1)
Sodium: 140 mEq/L (ref 135–145)

## 2021-12-11 DIAGNOSIS — H25013 Cortical age-related cataract, bilateral: Secondary | ICD-10-CM | POA: Diagnosis not present

## 2021-12-11 DIAGNOSIS — H524 Presbyopia: Secondary | ICD-10-CM | POA: Diagnosis not present

## 2021-12-11 DIAGNOSIS — H43393 Other vitreous opacities, bilateral: Secondary | ICD-10-CM | POA: Diagnosis not present

## 2021-12-11 DIAGNOSIS — H2513 Age-related nuclear cataract, bilateral: Secondary | ICD-10-CM | POA: Diagnosis not present

## 2021-12-11 LAB — PARATHYROID HORMONE, INTACT (NO CA): PTH: 30 pg/mL (ref 16–77)

## 2022-04-10 ENCOUNTER — Ambulatory Visit (INDEPENDENT_AMBULATORY_CARE_PROVIDER_SITE_OTHER): Payer: Medicare Other

## 2022-04-10 VITALS — Wt 168.0 lb

## 2022-04-10 DIAGNOSIS — Z0000 Encounter for general adult medical examination without abnormal findings: Secondary | ICD-10-CM | POA: Diagnosis not present

## 2022-04-10 NOTE — Patient Instructions (Signed)
Valerie Paul , Thank you for taking time to come for your Medicare Wellness Visit. I appreciate your ongoing commitment to your health goals. Please review the following plan we discussed and let me know if I can assist you in the future.   These are the goals we discussed:  Goals      Increase physical activity     Starting 12/17/2017, I will continue to exercise for at least 4 hours 3 days per week.      Patient Stated     12/22/2018, I will start back trying to exercise daily.     Patient Stated     04/07/2020, I will maintain and continue medications as prescribed.      Patient Stated     Would like to maintain current routine     Patient Stated     Lose 10 lbs.         This is a list of the screening recommended for you and due dates:  Health Maintenance  Topic Date Due   Zoster (Shingles) Vaccine (1 of 2) Never done   COVID-19 Vaccine (3 - Moderna risk series) 02/15/2020   Flu Shot  10/09/2021   DTaP/Tdap/Td vaccine (1 - Tdap) 06/30/2023*   Medicare Annual Wellness Visit  04/11/2023   Pneumonia Vaccine  Completed   DEXA scan (bone density measurement)  Completed   Hepatitis C Screening: USPSTF Recommendation to screen - Ages 55-79 yo.  Completed   HPV Vaccine  Aged Out   Colon Cancer Screening  Discontinued  *Topic was postponed. The date shown is not the original due date.    Advanced directives: no  Conditions/risks identified: none  Next appointment: Follow up in one year for your annual wellness visit 04/14/23 @ 2:30 pm by phone   Preventive Care 65 Years and Older, Female Preventive care refers to lifestyle choices and visits with your health care provider that can promote health and wellness. What does preventive care include? A yearly physical exam. This is also called an annual well check. Dental exams once or twice a year. Routine eye exams. Ask your health care provider how often you should have your eyes checked. Personal lifestyle choices,  including: Daily care of your teeth and gums. Regular physical activity. Eating a healthy diet. Avoiding tobacco and drug use. Limiting alcohol use. Practicing safe sex. Taking low-dose aspirin every day. Taking vitamin and mineral supplements as recommended by your health care provider. What happens during an annual well check? The services and screenings done by your health care provider during your annual well check will depend on your age, overall health, lifestyle risk factors, and family history of disease. Counseling  Your health care provider may ask you questions about your: Alcohol use. Tobacco use. Drug use. Emotional well-being. Home and relationship well-being. Sexual activity. Eating habits. History of falls. Memory and ability to understand (cognition). Work and work Statistician. Reproductive health. Screening  You may have the following tests or measurements: Height, weight, and BMI. Blood pressure. Lipid and cholesterol levels. These may be checked every 5 years, or more frequently if you are over 52 years old. Skin check. Lung cancer screening. You may have this screening every year starting at age 33 if you have a 30-pack-year history of smoking and currently smoke or have quit within the past 15 years. Fecal occult blood test (FOBT) of the stool. You may have this test every year starting at age 57. Flexible sigmoidoscopy or colonoscopy. You may have a  sigmoidoscopy every 5 years or a colonoscopy every 10 years starting at age 19. Hepatitis C blood test. Hepatitis B blood test. Sexually transmitted disease (STD) testing. Diabetes screening. This is done by checking your blood sugar (glucose) after you have not eaten for a while (fasting). You may have this done every 1-3 years. Bone density scan. This is done to screen for osteoporosis. You may have this done starting at age 2. Mammogram. This may be done every 1-2 years. Talk to your health care provider  about how often you should have regular mammograms. Talk with your health care provider about your test results, treatment options, and if necessary, the need for more tests. Vaccines  Your health care provider may recommend certain vaccines, such as: Influenza vaccine. This is recommended every year. Tetanus, diphtheria, and acellular pertussis (Tdap, Td) vaccine. You may need a Td booster every 10 years. Zoster vaccine. You may need this after age 28. Pneumococcal 13-valent conjugate (PCV13) vaccine. One dose is recommended after age 43. Pneumococcal polysaccharide (PPSV23) vaccine. One dose is recommended after age 27. Talk to your health care provider about which screenings and vaccines you need and how often you need them. This information is not intended to replace advice given to you by your health care provider. Make sure you discuss any questions you have with your health care provider. Document Released: 03/24/2015 Document Revised: 11/15/2015 Document Reviewed: 12/27/2014 Elsevier Interactive Patient Education  2017 [place] Prevention in the Home Falls can cause injuries. They can happen to people of all ages. There are many things you can do to make your home safe and to help prevent falls. What can I do on the outside of my home? Regularly fix the edges of walkways and driveways and fix any cracks. Remove anything that might make you trip as you walk through a door, such as a raised step or threshold. Trim any bushes or trees on the path to your home. Use bright outdoor lighting. Clear any walking paths of anything that might make someone trip, such as rocks or tools. Regularly check to see if handrails are loose or broken. Make sure that both sides of any steps have handrails. Any raised decks and porches should have guardrails on the edges. Have any leaves, snow, or ice cleared regularly. Use sand or salt on walking paths during winter. Clean up any spills in  your garage right away. This includes oil or grease spills. What can I do in the bathroom? Use night lights. Install grab bars by the toilet and in the tub and shower. Do not use towel bars as grab bars. Use non-skid mats or decals in the tub or shower. If you need to sit down in the shower, use a plastic, non-slip stool. Keep the floor dry. Clean up any water that spills on the floor as soon as it happens. Remove soap buildup in the tub or shower regularly. Attach bath mats securely with double-sided non-slip rug tape. Do not have throw rugs and other things on the floor that can make you trip. What can I do in the bedroom? Use night lights. Make sure that you have a light by your bed that is easy to reach. Do not use any sheets or blankets that are too big for your bed. They should not hang down onto the floor. Have a firm chair that has side arms. You can use this for support while you get dressed. Do not have throw rugs and other  things on the floor that can make you trip. What can I do in the kitchen? Clean up any spills right away. Avoid walking on wet floors. Keep items that you use a lot in easy-to-reach places. If you need to reach something above you, use a strong step stool that has a grab bar. Keep electrical cords out of the way. Do not use floor polish or wax that makes floors slippery. If you must use wax, use non-skid floor wax. Do not have throw rugs and other things on the floor that can make you trip. What can I do with my stairs? Do not leave any items on the stairs. Make sure that there are handrails on both sides of the stairs and use them. Fix handrails that are broken or loose. Make sure that handrails are as long as the stairways. Check any carpeting to make sure that it is firmly attached to the stairs. Fix any carpet that is loose or worn. Avoid having throw rugs at the top or bottom of the stairs. If you do have throw rugs, attach them to the floor with carpet  tape. Make sure that you have a light switch at the top of the stairs and the bottom of the stairs. If you do not have them, ask someone to add them for you. What else can I do to help prevent falls? Wear shoes that: Do not have high heels. Have rubber bottoms. Are comfortable and fit you well. Are closed at the toe. Do not wear sandals. If you use a stepladder: Make sure that it is fully opened. Do not climb a closed stepladder. Make sure that both sides of the stepladder are locked into place. Ask someone to hold it for you, if possible. Clearly mark and make sure that you can see: Any grab bars or handrails. First and last steps. Where the edge of each step is. Use tools that help you move around (mobility aids) if they are needed. These include: Canes. Walkers. Scooters. Crutches. Turn on the lights when you go into a dark area. Replace any light bulbs as soon as they burn out. Set up your furniture so you have a clear path. Avoid moving your furniture around. If any of your floors are uneven, fix them. If there are any pets around you, be aware of where they are. Review your medicines with your doctor. Some medicines can make you feel dizzy. This can increase your chance of falling. Ask your doctor what other things that you can do to help prevent falls. This information is not intended to replace advice given to you by your health care provider. Make sure you discuss any questions you have with your health care provider. Document Released: 12/22/2008 Document Revised: 08/03/2015 Document Reviewed: 04/01/2014 Elsevier Interactive Patient Education  2017 [employer].

## 2022-04-10 NOTE — Progress Notes (Signed)
Virtual Visit via Telephone Note  I connected with  Valerie Paul on 04/10/22 at  3:00 PM EST by telephone and verified that I am speaking with the correct person using two identifiers.  Location: Patient: home Provider: [place] Persons participating in the virtual visit: [place]   I discussed the limitations, risks, security and privacy concerns of performing an evaluation and management service by telephone and the availability of in person appointments. The patient expressed understanding and agreed to proceed.  Interactive audio and video telecommunications were attempted between this nurse and patient, however failed, due to patient having technical difficulties OR patient did not have access to video capability.  We continued and completed visit with audio only.  Some vital signs may be absent or patient reported.   Dionisio David, LPN  Subjective:   Valerie Paul is a 77 y.o. female who presents for Medicare Annual (Subsequent) preventive examination.  Review of Systems     Cardiac Risk Factors include: advanced age (>52mn, >>61women);dyslipidemia     Objective:    There were no vitals filed for this visit. There is no height or weight on file to calculate BMI.     04/10/2022    3:09 PM 04/09/2021    2:03 PM 04/07/2020    1:59 PM 12/22/2018   12:06 PM 12/17/2017   12:56 PM 11/15/2016   11:03 AM 01/20/2016    4:13 PM  Advanced Directives  Does Patient Have a Medical Advance Directive? No No No No No No No  Does patient want to make changes to medical advance directive?   No - Patient declined      Would patient like information on creating a medical advance directive? No - Patient declined Yes (MAU/Ambulatory/Procedural Areas - Information given)  No - Patient declined No - Patient declined  No - patient declined information    Current Medications (verified) Outpatient Encounter Medications as of 04/10/2022  Medication Sig   aspirin 81  MG tablet Take 1 tablet (81 mg total) by mouth 2 (two) times a week. (Patient taking differently: Take 81 mg by mouth once a week.)   atorvastatin (LIPITOR) 20 MG tablet Take 1 tablet (20 mg total) by mouth daily.   Cholecalciferol (VITAMIN D3) 50 MCG (2000 UT) capsule Take 1 capsule (2,000 Units total) by mouth daily.   GARLIC PO Take by mouth daily.   Multiple Vitamin (MULTIVITAMIN) tablet Take 1 tablet by mouth daily.   vitamin C (ASCORBIC ACID) 500 MG tablet Take 500 mg by mouth daily.   No facility-administered encounter medications on file as of 04/10/2022.    Allergies (verified) Patient has no known allergies.   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/p benign biopsy ([facility])   History of anemia    HLD (hyperlipidemia)    Infertility 1970s   tube blockage, G0P0   Seasonal allergies    pollen   Past Surgical History:  Procedure Laterality Date   APPENDECTOMY  1977   BIOPSY THYROID  2006   normal per pt ([facility])   COLONOSCOPY  02/2018   4 HP polyps, consider rpt 10 yrs ([facility])   DEXA  08/2015   WNL   DILATION AND CURETTAGE OF UTERUS     LAPAROSCOPY  1982   MYOMECTOMY  1977   fibroids  OVARIAN CYST REMOVAL     right   Family History  Problem Relation Age of Onset   Cancer Mother 58       brain   Heart failure Father    Learning disabilities Daughter    Hypertension Maternal Aunt    Breast cancer Cousin        53s   Cancer Brother 39       lung (smoker)   CAD Neg Hx    Stroke Neg Hx    Diabetes Neg Hx    Colon cancer Neg Hx    Esophageal cancer Neg Hx    Stomach cancer Neg Hx    Rectal cancer Neg Hx    Social History   Socioeconomic History   Marital status: Married    Spouse name: Not on file   Number of children: Not on file   Years of education: Not on file   Highest education level: Not on file  Occupational History   Not on  file  Tobacco Use   Smoking status: Former    Packs/day: 1.00    Types: Cigarettes    Start date: 03/12/1963    Quit date: 03/12/2003    Years since quitting: 19.0   Smokeless tobacco: Never  Vaping Use   Vaping Use: Never used  Substance and Sexual Activity   Alcohol use: Yes    Comment: Rare   Drug use: No   Sexual activity: Never  Other Topics Concern   Not on file  Social History Narrative   Lives with husband, no pets   Occupation: retired, was Engineer, mining   Edu: BS business administration   Activity: exercise class 3x/wk, walking    Diet: good water, some fruits/vegetables   Social Determinants of Health   Financial Resource Strain: Low Risk  (04/10/2022)   Overall Financial Resource Strain (CARDIA)    Difficulty of Paying Living Expenses: Not hard at all  Food Insecurity: No Food Insecurity (04/10/2022)   Hunger Vital Sign    Worried About Running Out of Food in the Last Year: Never true    [place] in the Last Year: Never true  Transportation Needs: No Transportation Needs (04/10/2022)   PRAPARE - Hydrologist (Medical): No    Lack of Transportation (Non-Medical): No  Physical Activity: Insufficiently Active (04/10/2022)   Exercise Vital Sign    Days of Exercise per Week: 2 days    Minutes of Exercise per Session: 60 min  Stress: No Stress Concern Present (04/10/2022)   [place]    Feeling of Stress : Not at all  Social Connections: Moderately Isolated (04/10/2022)   Social Connection and Isolation Panel [NHANES]    Frequency of Communication with Friends and Family: Three times a week    Frequency of Social Gatherings with Friends and Family: Once a week    Attends Religious Services: Never    Marine scientist or Organizations: No    Attends Music therapist: Never    Marital Status: Married    Tobacco Counseling Counseling given: Not  Answered   Clinical Intake:  Pre-visit preparation completed: Yes  Pain : No/denies pain     Nutritional Risks: None Diabetes: No  How often do you need to have someone help you when you read instructions, pamphlets, or other written materials from your doctor or pharmacy?: 1 - Never  Diabetic?no  Interpreter Needed?: No  Information entered by :: Kirke Shaggy, LPN   Activities of Daily Living    04/10/2022    3:10 PM 04/08/2022    8:57 AM  In your present state of health, do you have any difficulty performing the following activities:  Hearing? 0 0  Vision? 0 0  Difficulty concentrating or making decisions? 0 0  Walking or climbing stairs? 0 0  Dressing or bathing? 0 0  Doing errands, shopping? 0 0  Preparing Food and eating ? N N  Using the Toilet? N N  In the past six months, have you accidently leaked urine? N N  Do you have problems with loss of bowel control? N N  Managing your Medications? N N  Managing your Finances? N N  Housekeeping or managing your Housekeeping? N N    Patient Care Team: Ria Bush, MD as PCP - General (Family Medicine)  Indicate any recent Medical Services you may have received from other than [facility] providers in the past year (date may be approximate).     Assessment:   This is a routine wellness examination for Valerie Paul.  Hearing/Vision screen Hearing Screening - Comments:: No aids Vision Screening - Comments:: Wears glasses- Dr.Ritter  Dietary issues and exercise activities discussed: Current Exercise Habits: Home exercise routine, Time (Minutes): 60, Frequency (Times/Week): 2, Weekly Exercise (Minutes/Week): 120, Intensity: Moderate   Goals Addressed             This Visit's Progress    Patient Stated       Lose 10 lbs.        Depression Screen    04/10/2022    3:08 PM 04/09/2021    2:06 PM 04/07/2020    2:01 PM 12/22/2018   12:08 PM 12/17/2017   12:56 PM 11/15/2016   11:02 AM 06/30/2015   10:35 AM  PHQ 2/9  Scores  PHQ - 2 Score 0 0 0 0 0 0 0  PHQ- 9 Score 0  0 0 0 2     Fall Risk    04/10/2022    3:10 PM 04/08/2022    8:57 AM 04/09/2021    2:05 PM 04/07/2020    2:01 PM 12/22/2018   12:08 PM  Fall Risk   Falls in the past year? 0 0 0 0 0  Number falls in past yr: 0 0 0 0   Injury with Fall? 0 0 0 0   Risk for fall due to : No Fall Risks  No Fall Risks No Fall Risks Medication side effect  Follow up Falls prevention discussed;Falls evaluation completed  Falls prevention discussed Falls evaluation completed;Falls prevention discussed Falls evaluation completed;Falls prevention discussed    FALL RISK PREVENTION PERTAINING TO THE HOME:  Any stairs in or around the home? Yes  If so, are there any without handrails? No  Home free of loose throw rugs in walkways, pet beds, electrical cords, etc? Yes  Adequate lighting in your home to reduce risk of falls? Yes   ASSISTIVE DEVICES UTILIZED TO PREVENT FALLS:  Life alert? No  Use of a cane, walker or w/c? No  Grab bars in the bathroom? No  Shower chair or bench in shower? Yes  Elevated toilet seat or a handicapped toilet? Yes    Cognitive Function:    04/07/2020    2:03 PM 12/22/2018   12:12 PM 12/17/2017   12:56 PM 11/15/2016   11:02 AM 06/30/2015   10:40 AM  MMSE - Mini Mental State Exam  Orientation  to time '5 5 5 5 5  '$ Orientation to Place '5 5 5 5 5  '$ Registration '3 3 3 3 3  '$ Attention/ Calculation 5 5 0 0 0  Recall '3 3 3 3 3  '$ Language- name 2 objects   0 0 0  Language- repeat '1 1 1 1 1  '$ Language- follow 3 step command   '3 3 3  '$ Language- read & follow direction   0 0 0  Write a sentence   0 0 0  Copy design   0 0 0  Total score   '20 20 20        '$ 04/10/2022    3:17 PM  6CIT Screen  What month? 0 points  What time? 0 points  Count back from 20 0 points  Months in reverse 0 points  Repeat phrase 0 points    Immunizations Immunization History  Administered Date(s) Administered   Fluad Quad(high Dose 65+) 12/29/2018,  12/01/2019   Influenza, High Dose Seasonal PF 01/04/2021   Influenza,inj,Quad PF,6+ Mos 01/08/2013, 02/06/2016, 12/17/2017   Influenza-Unspecified 12/01/2013   Moderna SARS-COV2 Booster Vaccination 01/18/2020   Moderna Sars-Covid-2 Vaccination 04/06/2019, 05/04/2019   Pneumococcal Conjugate-13 01/12/2014   Pneumococcal Polysaccharide-23 01/08/2013    TDAP status: Due, Education has been provided regarding the importance of this vaccine. Advised may receive this vaccine at local pharmacy or Health Dept. Aware to provide a copy of the vaccination record if obtained from local pharmacy or Health Dept. Verbalized acceptance and understanding.  Flu Vaccine status: Declined, Education has been provided regarding the importance of this vaccine but patient still declined. Advised may receive this vaccine at local pharmacy or Health Dept. Aware to provide a copy of the vaccination record if obtained from local pharmacy or Health Dept. Verbalized acceptance and understanding.  Pneumococcal vaccine status: Due, Education has been provided regarding the importance of this vaccine. Advised may receive this vaccine at local pharmacy or Health Dept. Aware to provide a copy of the vaccination record if obtained from local pharmacy or Health Dept. Verbalized acceptance and understanding.  Covid-19 vaccine status: Completed vaccines  Qualifies for Shingles Vaccine? Yes   Zostavax completed No   Shingrix Completed?: No.    Education has been provided regarding the importance of this vaccine. Patient has been advised to call insurance company to determine out of pocket expense if they have not yet received this vaccine. Advised may also receive vaccine at local pharmacy or Health Dept. Verbalized acceptance and understanding.  Screening Tests Health Maintenance  Topic Date Due   Zoster Vaccines- Shingrix (1 of 2) Never done   COVID-19 Vaccine (3 - Moderna risk series) 02/15/2020   INFLUENZA VACCINE   10/09/2021   DTaP/Tdap/Td (1 - Tdap) 06/30/2023 (Originally 12/03/1964)   Medicare Annual Wellness (AWV)  04/11/2023   Pneumonia Vaccine 12+ Years old  Completed   DEXA SCAN  Completed   Hepatitis C Screening  Completed   HPV VACCINES  Aged Out   COLONOSCOPY (Pts 45-45yr Insurance coverage will need to be confirmed)  Discontinued    Health Maintenance  Health Maintenance Due  Topic Date Due   Zoster Vaccines- Shingrix (1 of 2) Never done   COVID-19 Vaccine (3 - Moderna risk series) 02/15/2020   INFLUENZA VACCINE  10/09/2021    Colorectal cancer screening: No longer required.   Mammogram status: Completed 07/28/21. Repeat every year  Bone Density status: Completed 07/18/21. Results reflect: Bone density results: OSTEOPENIA. Repeat every 5 years.  Lung Cancer  Screening: (Low Dose CT Chest recommended if Age 5-80 years, 30 pack-year currently smoking OR have quit w/in 15years.) does not qualify.    Additional Screening:  Hepatitis C Screening: does qualify; Completed 06/30/15  Vision Screening: Recommended annual ophthalmology exams for early detection of glaucoma and other disorders of the eye. Is the patient up to date with their annual eye exam?  Yes  Who is the provider or what is the name of the office in which the patient attends annual eye exams? Dr.Ritter If pt is not established with a provider, would they like to be referred to a provider to establish care? No .   Dental Screening: Recommended annual dental exams for proper oral hygiene  Community Resource Referral / Chronic Care Management: CRR required this visit?  No   CCM required this visit?  No      Plan:     I have personally reviewed and noted the following in the patient's chart:   Medical and social history Use of alcohol, tobacco or illicit drugs  Current medications and supplements including opioid prescriptions. Patient is not currently taking opioid prescriptions. Functional ability and  status Nutritional status Physical activity Advanced directives List of other physicians Hospitalizations, surgeries, and ER visits in previous 12 months Vitals Screenings to include cognitive, depression, and falls Referrals and appointments  In addition, I have reviewed and discussed with patient certain preventive protocols, quality metrics, and best practice recommendations. A written personalized care plan for preventive services as well as general preventive health recommendations were provided to patient.     Dionisio David, LPN   [id number]/61/[id number]   Nurse Notes: none

## 2022-08-05 IMAGING — MG MM DIGITAL DIAGNOSTIC UNILAT*R* W/ TOMO W/ CAD
8 series · 8 of 24 positions shown · non-contrast
Comparison: Previous exam(s).

CLINICAL DATA: Patient returns after screening study for evaluation
of possible RIGHT breast mass.

EXAM:
DIGITAL DIAGNOSTIC UNILATERAL RIGHT MAMMOGRAM WITH TOMOSYNTHESIS AND
CAD
TECHNIQUE: Right digital diagnostic mammography and breast tomosynthesis was
performed. The images were evaluated with computer-aided detection.

[R CC synth-2D]
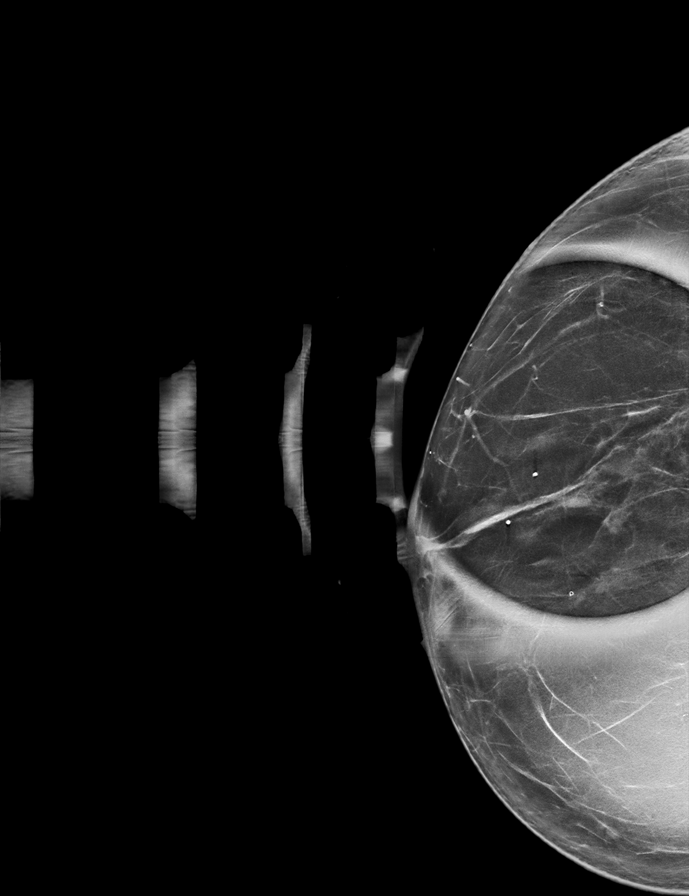

[R MLO synth-2D (1 of 2)]
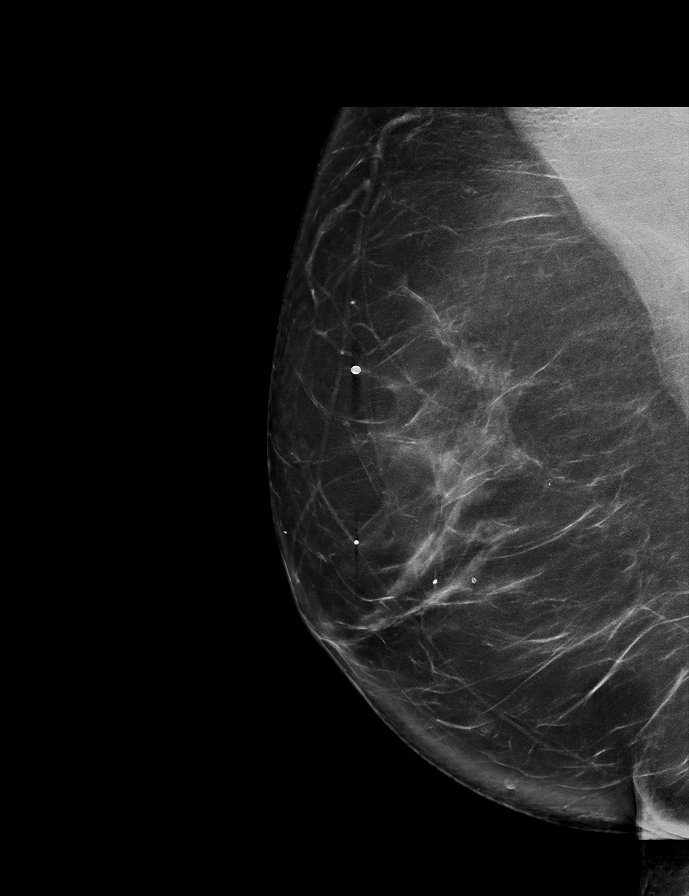

[R ML synth-2D]
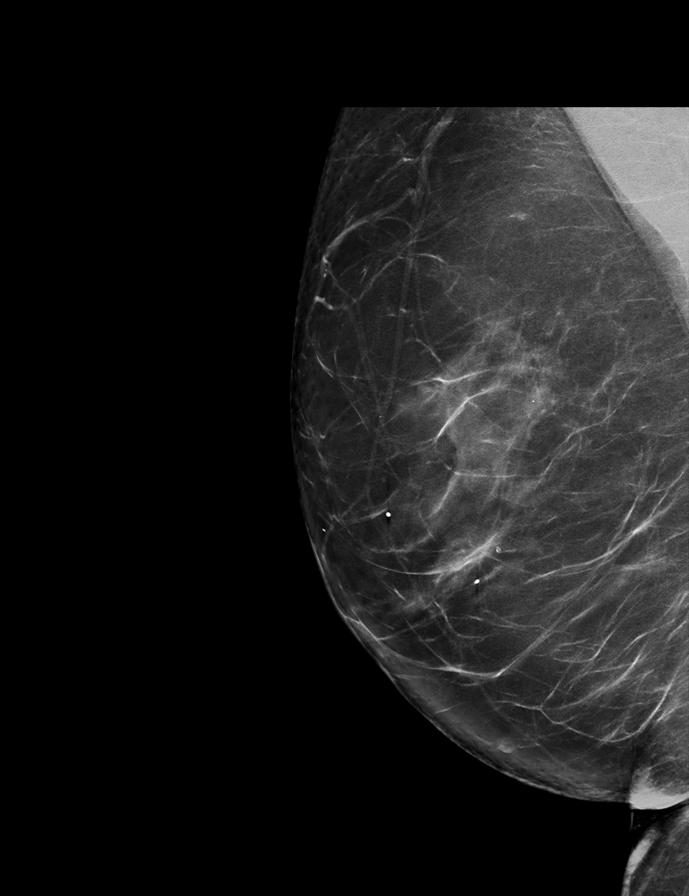

[R MLO synth-2D (2 of 2)]
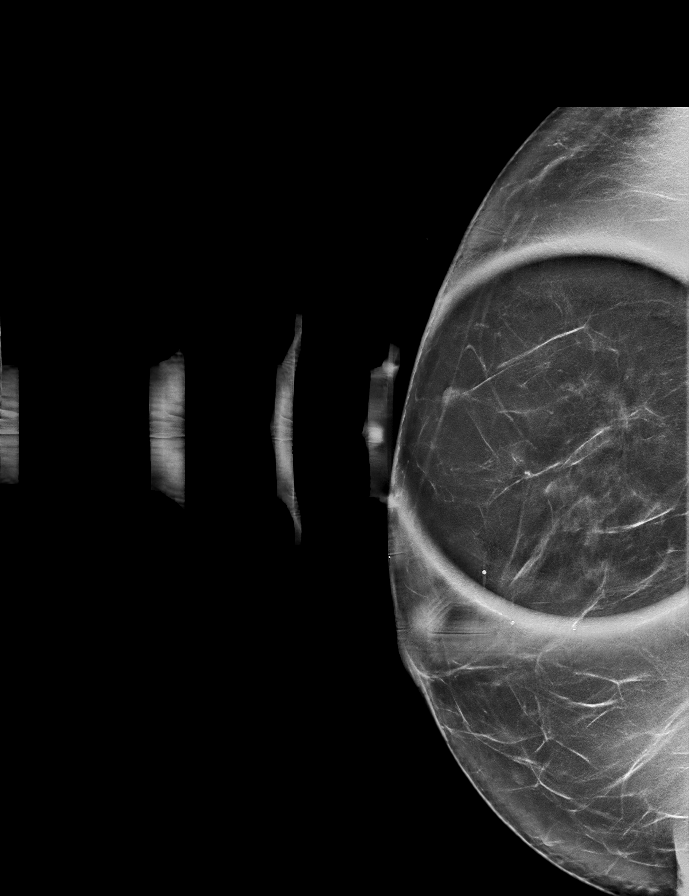

[R MLO tomo (1 of 2) · tomo slice 43/86.0]
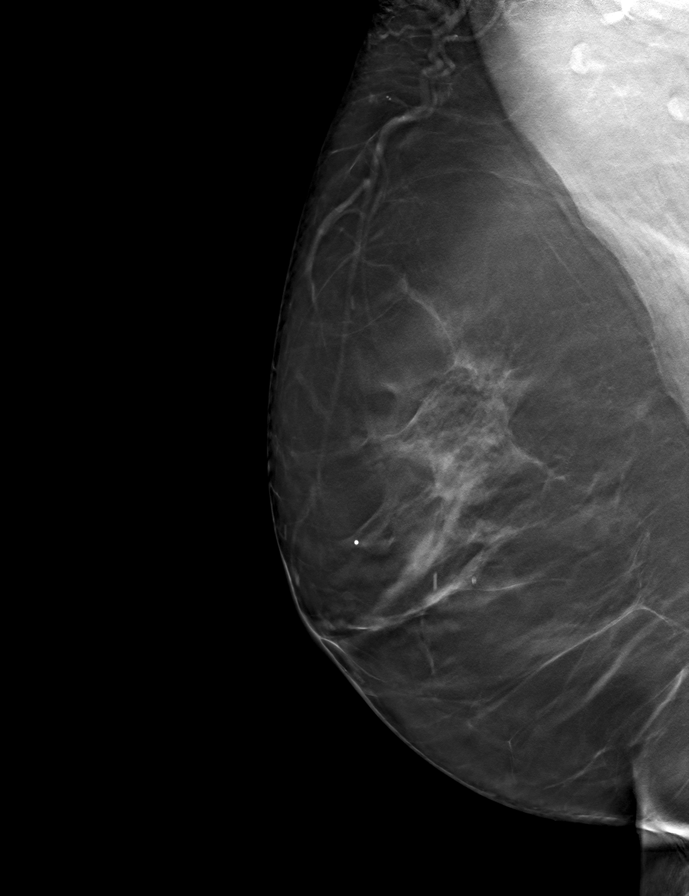

[R CC tomo · tomo slice 35/69.0]
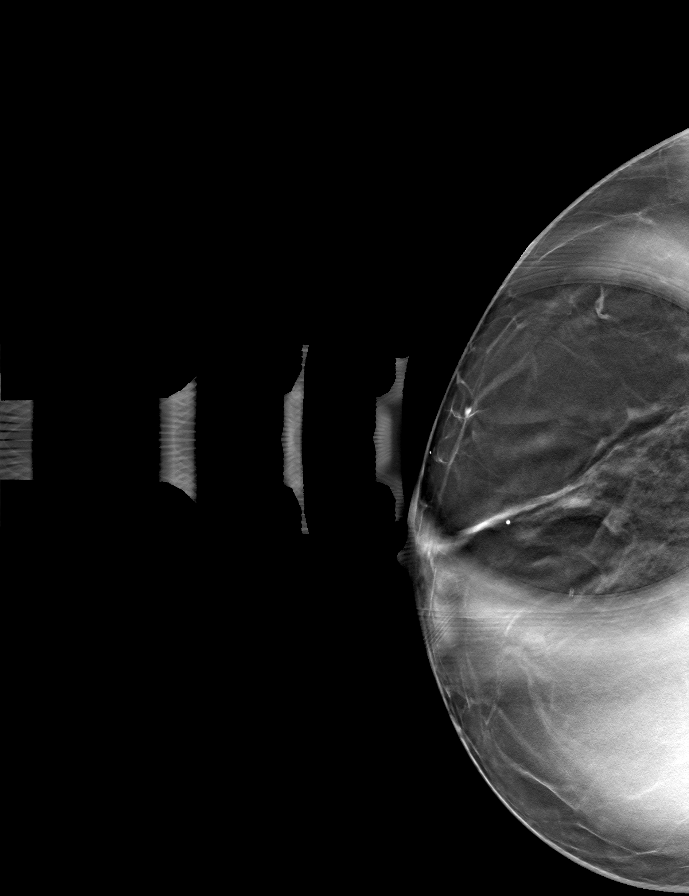

[R MLO tomo (2 of 2) · tomo slice 39/77.0]
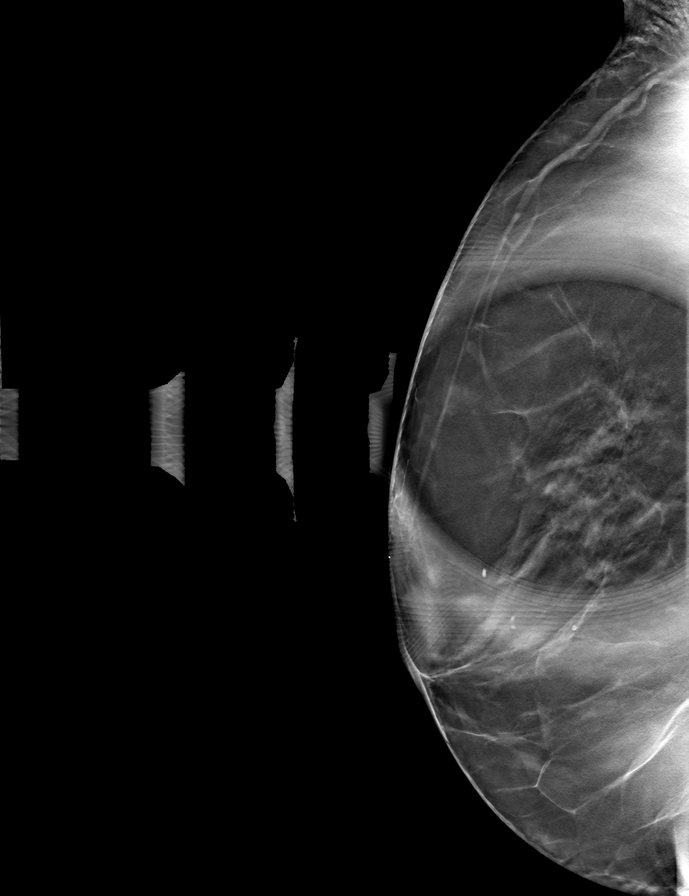

[R ML tomo · tomo slice 43/86.0]
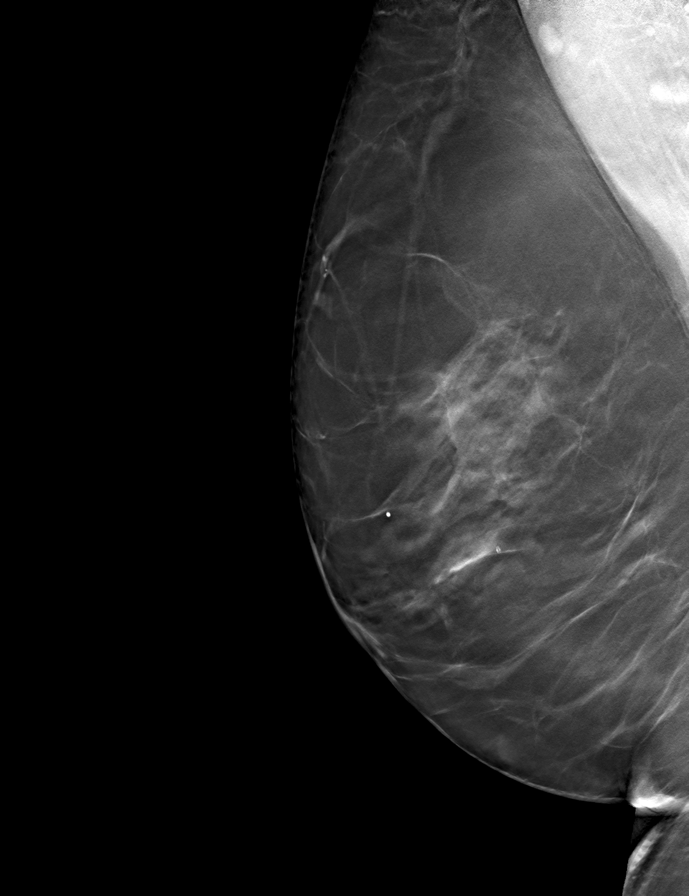

[8 of 24 positions shown; findings below may reference images not displayed]

ACR Breast Density Category b: There are scattered areas of
fibroglandular density.
FINDINGS: Additional 2-D and 3-D images are performed. These views confirm
presence of a superficial oval mass in the LATERAL portion of the
RIGHT breast, best seen on the oblique projection.

On physical exam, there is a small raised mole the 10 o'clock
location of the RIGHT breast. Mole is marked for subsequent MLO
projection, confirming that the mammographically detected mass
represents the mole seen on physical exam. No parenchymal mass
identified.
IMPRESSION: No mammographic evidence for malignancy. Small mole in the 10
o'clock location of the RIGHT breast accounts for the mammographic
finding.

RECOMMENDATION:
Screening mammogram in one year.(Code:[id number])

I have discussed the findings and recommendations with the patient.
If applicable, a reminder letter will be sent to the patient
regarding the next appointment.

BI-RADS CATEGORY  1: Negative.

## 2022-11-02 ENCOUNTER — Other Ambulatory Visit: Payer: Self-pay | Admitting: Family Medicine

## 2023-01-01 DIAGNOSIS — Z23 Encounter for immunization: Secondary | ICD-10-CM | POA: Diagnosis not present

## 2023-02-07 ENCOUNTER — Other Ambulatory Visit: Payer: Self-pay | Admitting: Family Medicine

## 2023-02-10 NOTE — Telephone Encounter (Signed)
ERx Plz notify patient and schedule CPE as due - last seen 07/2021

## 2023-04-13 ENCOUNTER — Other Ambulatory Visit: Payer: Self-pay | Admitting: Family Medicine

## 2023-04-13 DIAGNOSIS — E785 Hyperlipidemia, unspecified: Secondary | ICD-10-CM

## 2023-04-13 DIAGNOSIS — N1831 Chronic kidney disease, stage 3a: Secondary | ICD-10-CM

## 2023-04-13 DIAGNOSIS — E049 Nontoxic goiter, unspecified: Secondary | ICD-10-CM

## 2023-04-13 DIAGNOSIS — E559 Vitamin D deficiency, unspecified: Secondary | ICD-10-CM

## 2023-04-14 ENCOUNTER — Ambulatory Visit (INDEPENDENT_AMBULATORY_CARE_PROVIDER_SITE_OTHER): Payer: Medicare Other

## 2023-04-14 VITALS — Ht 62.0 in | Wt 168.0 lb

## 2023-04-14 DIAGNOSIS — Z0000 Encounter for general adult medical examination without abnormal findings: Secondary | ICD-10-CM

## 2023-04-14 NOTE — Progress Notes (Signed)
Subjective:   Valerie Paul is a 78 y.o. female who presents for Medicare Annual (Subsequent) preventive examination.  Visit Complete: Virtual I connected with  Denis M Coopman on 04/14/23 by a audio enabled telemedicine application and verified that I am speaking with the correct person using two identifiers.  Patient Location: Home  Provider Location: Home Office  I discussed the limitations of evaluation and management by telemedicine. The patient expressed understanding and agreed to proceed.  Vital Signs: Because this visit was a virtual/telehealth visit, some criteria may be missing or patient reported. Any vitals not documented were not able to be obtained and vitals that have been documented are patient reported.  Patient Medicare AWV questionnaire was completed by the patient on 04/12/23; I have confirmed that all information answered by patient is correct and no changes since this date. Cardiac Risk Factors include: advanced age (>33men, >25 women);dyslipidemia;obesity (BMI >30kg/m2)    Objective:    Today's Vitals   04/14/23 1424  Weight: 168 lb (76.2 kg)  Height: 5\' 2"  (1.575 m)   Body mass index is 30.73 kg/m.     04/14/2023    2:35 PM 04/10/2022    3:09 PM 04/09/2021    2:03 PM 04/07/2020    1:59 PM 12/22/2018   12:06 PM 12/17/2017   12:56 PM 11/15/2016   11:03 AM  Advanced Directives  Does Patient Have a Medical Advance Directive? No No No No No No No  Does patient want to make changes to medical advance directive?    No - Patient declined     Would patient like information on creating a medical advance directive?  No - Patient declined Yes (MAU/Ambulatory/Procedural Areas - Information given)  No - Patient declined No - Patient declined     Current Medications (verified) Outpatient Encounter Medications as of 04/14/2023  Medication Sig   aspirin 81 MG tablet Take 1 tablet (81 mg total) by mouth 2 (two) times a week. (Patient taking differently: Take 81 mg by mouth  once a week.)   atorvastatin (LIPITOR) 20 MG tablet TAKE 1 TABLET BY MOUTH EVERY DAY   Cholecalciferol (VITAMIN D3) 50 MCG (2000 UT) capsule Take 1 capsule (2,000 Units total) by mouth daily.   GARLIC PO Take by mouth daily.   Multiple Vitamin (MULTIVITAMIN) tablet Take 1 tablet by mouth daily.   vitamin C (ASCORBIC ACID) 500 MG tablet Take 500 mg by mouth daily.   No facility-administered encounter medications on file as of 04/14/2023.    Allergies (verified) Patient has no known allergies.   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/p benign biopsy ([facility])   History of anemia    HLD (hyperlipidemia)    Infertility 1970s   tube blockage, G0P0   Seasonal allergies    pollen   Past Surgical History:  Procedure Laterality Date   APPENDECTOMY  1977   BIOPSY THYROID  2006   normal per pt ([facility])   COLONOSCOPY  02/2018   4 HP polyps, consider rpt 10 yrs ([facility])   DEXA  08/2015   WNL   DILATION AND CURETTAGE OF UTERUS     LAPAROSCOPY  1982   MYOMECTOMY  1977   fibroids   OVARIAN CYST REMOVAL     right   Family History  Problem Relation Age of Onset  Cancer Mother 30       brain   Heart failure Father    Learning disabilities Daughter    Hypertension Maternal Aunt    Breast cancer Cousin        42s   Cancer Brother 21       lung (smoker)   CAD Neg Hx    Stroke Neg Hx    Diabetes Neg Hx    Colon cancer Neg Hx    Esophageal cancer Neg Hx    Stomach cancer Neg Hx    Rectal cancer Neg Hx    Social History   Socioeconomic History   Marital status: Married    Spouse name: Not on file   Number of children: Not on file   Years of education: Not on file   Highest education level: Not on file  Occupational History   Not on file  Tobacco Use   Smoking status: Former    Current packs/day: 0.00    Average packs/day: 1 pack/day for 40.0 years (40.0 ttl  pk-yrs)    Types: Cigarettes    Start date: 03/12/1963    Quit date: 03/12/2003    Years since quitting: 20.1   Smokeless tobacco: Never  Vaping Use   Vaping status: Never Used  Substance and Sexual Activity   Alcohol use: Yes    Comment: Rare   Drug use: No   Sexual activity: Never  Other Topics Concern   Not on file  Social History Narrative   Lives with husband, no pets   Occupation: retired, was Wellsite geologist   Edu: BS business administration   Activity: exercise class 3x/wk, walking    Diet: good water, some fruits/vegetables   Social Drivers of Corporate investment banker Strain: Low Risk  (04/14/2023)   Overall Financial Resource Strain (CARDIA)    Difficulty of Paying Living Expenses: Not hard at all  Food Insecurity: No Food Insecurity (04/14/2023)   Hunger Vital Sign    Worried About Running Out of Food in the Last Year: Never true    Ran Out of Food in the Last Year: Never true  Transportation Needs: No Transportation Needs (04/14/2023)   PRAPARE - Administrator, Civil Service (Medical): No    Lack of Transportation (Non-Medical): No  Physical Activity: Insufficiently Active (04/14/2023)   Exercise Vital Sign    Days of Exercise per Week: 2 days    Minutes of Exercise per Session: 30 min  Stress: No Stress Concern Present (04/14/2023)   [employer] of Occupational Health - Occupational Stress Questionnaire    Feeling of Stress : Not at all  Social Connections: Moderately Integrated (04/14/2023)   Social Connection and Isolation Panel [NHANES]    Frequency of Communication with Friends and Family: Three times a week    Frequency of Social Gatherings with Friends and Family: Twice a week    Attends Religious Services: 1 to 4 times per year    Active Member of [employer] or Organizations: No    Attends Engineer, structural: Never    Marital Status: Married    Tobacco Counseling Counseling given: Not Answered   Clinical Intake:  Pre-visit  preparation completed: Yes  Pain : No/denies pain    BMI - recorded: 30.73 Nutritional Status: BMI > 30  Obese Nutritional Risks: None Diabetes: No  How often do you need to have someone help you when you read instructions, pamphlets, or other written materials from your doctor or pharmacy?:  1 - Never  Interpreter Needed?: No  Comments: lives with husband Information entered by :: B.Momen Ham,LPN   Activities of Daily Living    04/12/2023    8:46 AM  In your present state of health, do you have any difficulty performing the following activities:  Hearing? 0  Vision? 0  Difficulty concentrating or making decisions? 0  Walking or climbing stairs? 0  Dressing or bathing? 0  Doing errands, shopping? 0  Preparing Food and eating ? N  Using the Toilet? N  In the past six months, have you accidently leaked urine? N  Do you have problems with loss of bowel control? N  Managing your Medications? N  Managing your Finances? N  Housekeeping or managing your Housekeeping? N    Patient Care Team: Eustaquio Boyden, MD as PCP - General (Family Medicine)  Indicate any recent Medical Services you may have received from other than [facility] providers in the past year (date may be approximate).     Assessment:   This is a routine wellness examination for Valerie Paul.  Hearing/Vision screen Hearing Screening - Comments:: Pt says her hearing is fine Vision Screening - Comments:: Pt says her vision is ok; missed last year Making appt at [facility]   Goals Addressed             This Visit's Progress    Patient Stated   On track    04/14/23, I will maintain and continue medications as prescribed.      Patient Stated       04/14/23-Lose 5 lbs.        Depression Screen    04/14/2023    2:30 PM 04/10/2022    3:08 PM 04/09/2021    2:06 PM 04/07/2020    2:01 PM 12/22/2018   12:08 PM 12/17/2017   12:56 PM 11/15/2016   11:02 AM  PHQ 2/9 Scores  PHQ - 2 Score 0 0 0 0 0 0 0  PHQ- 9 Score  0  0  0 0 2    Fall Risk    04/12/2023    8:46 AM 04/10/2022    3:10 PM 04/08/2022    8:57 AM 04/09/2021    2:05 PM 04/07/2020    2:01 PM  Fall Risk   Falls in the past year? 0 0 0 0 0  Number falls in past yr: 0 0 0 0 0  Injury with Fall? 0 0 0 0 0  Risk for fall due to : No Fall Risks No Fall Risks  No Fall Risks No Fall Risks  Follow up Education provided;Falls prevention discussed Falls prevention discussed;Falls evaluation completed  Falls prevention discussed Falls evaluation completed;Falls prevention discussed    MEDICARE RISK AT HOME: Medicare Risk at Home Any stairs in or around the home?: (Patient-Rptd) Yes If so, are there any without handrails?: (Patient-Rptd) No Home free of loose throw rugs in walkways, pet beds, electrical cords, etc?: (Patient-Rptd) No Adequate lighting in your home to reduce risk of falls?: (Patient-Rptd) Yes Life alert?: (Patient-Rptd) No Use of a cane, walker or w/c?: (Patient-Rptd) No Grab bars in the bathroom?: (Patient-Rptd) No Shower chair or bench in shower?: (Patient-Rptd) No Elevated toilet seat or a handicapped toilet?: (Patient-Rptd) No  TIMED UP AND GO:  Was the test performed?  No    Cognitive Function:    04/07/2020    2:03 PM 12/22/2018   12:12 PM 12/17/2017   12:56 PM 11/15/2016   11:02 AM 06/30/2015   10:40  AM  MMSE - Mini Mental State Exam  Orientation to time 5 5 5 5 5   Orientation to Place 5 5 5 5 5   Registration 3 3 3 3 3   Attention/ Calculation 5 5 0 0 0  Recall 3 3 3 3 3   Language- name 2 objects   0 0 0  Language- repeat 1 1 1 1 1   Language- follow 3 step command   3 3 3   Language- read & follow direction   0 0 0  Write a sentence   0 0 0  Copy design   0 0 0  Total score   20 20 20         04/14/2023    2:36 PM 04/10/2022    3:17 PM  6CIT Screen  What Year? 0 points   What month? 0 points 0 points  What time? 0 points 0 points  Count back from 20 0 points 0 points  Months in reverse 0 points 0 points  Repeat  phrase 0 points 0 points  Total Score 0 points     Immunizations Immunization History  Administered Date(s) Administered   Fluad Quad(high Dose 65+) 12/29/2018, 12/01/2019   Influenza, High Dose Seasonal PF 01/04/2021   Influenza,inj,Quad PF,6+ Mos 01/08/2013, 02/06/2016, 12/17/2017   Influenza-Unspecified 12/01/2013   Moderna SARS-COV2 Booster Vaccination 01/18/2020   Moderna Sars-Covid-2 Vaccination 04/06/2019, 05/04/2019   Pneumococcal Conjugate-13 01/12/2014   Pneumococcal Polysaccharide-23 01/08/2013   Zoster Recombinant(Shingrix) 05/13/2022    TDAP status: Up to date  Flu Vaccine status: Up to date  Pneumococcal vaccine status: Up to date  Covid-19 vaccine status: Completed vaccines  Qualifies for Shingles Vaccine? Yes   Zostavax completed No  #1 Shingrix Completed?: No.    Education has been provided regarding the importance of this vaccine. Patient has been advised to call insurance company to determine out of pocket expense if they have not yet received this vaccine. Advised may also receive vaccine at local pharmacy or Health Dept. Verbalized acceptance and understanding.  Screening Tests Health Maintenance  Topic Date Due   COVID-19 Vaccine (3 - Moderna risk series) 02/15/2020   Zoster Vaccines- Shingrix (2 of 2) 07/08/2022   DTaP/Tdap/Td (1 - Tdap) 06/30/2023 (Originally 12/03/1964)   Medicare Annual Wellness (AWV)  04/13/2024   Pneumonia Vaccine 60+ Years old  Completed   INFLUENZA VACCINE  Completed   DEXA SCAN  Completed   Hepatitis C Screening  Completed   HPV VACCINES  Aged Out   Colonoscopy  Discontinued    Health Maintenance  Health Maintenance Due  Topic Date Due   COVID-19 Vaccine (3 - Moderna risk series) 02/15/2020   Zoster Vaccines- Shingrix (2 of 2) 07/08/2022    Colorectal cancer screening: No longer required.   Mammogram status: No longer required due to age.  Lung Cancer Screening: (Low Dose CT Chest recommended if Age 66-80 years,  20 pack-year currently smoking OR have quit w/in 15years.) does not qualify.   Lung Cancer Screening Referral: no  Additional Screening:  Hepatitis C Screening: does not qualify; Completed 06/30/2015  Vision Screening: Recommended annual ophthalmology exams for early detection of glaucoma and other disorders of the eye. Is the patient up to date with their annual eye exam?  No  Who is the provider or what is the name of the office in which the patient attends annual eye exams? Transitioning to [facility] If pt is not established with a provider, would they like to be referred to a  provider to establish care? No .   Dental Screening: Recommended annual dental exams for proper oral hygiene  Diabetic Foot Exam: n/a  Community Resource Referral / Chronic Care Management: CRR required this visit?  No   CCM required this visit?  No    Plan:     I have personally reviewed and noted the following in the patient's chart:   Medical and social history Use of alcohol, tobacco or illicit drugs  Current medications and supplements including opioid prescriptions. Patient is not currently taking opioid prescriptions. Functional ability and status Nutritional status Physical activity Advanced directives List of other physicians Hospitalizations, surgeries, and ER visits in previous 12 months Vitals Screenings to include cognitive, depression, and falls Referrals and appointments  In addition, I have reviewed and discussed with patient certain preventive protocols, quality metrics, and best practice recommendations. A written personalized care plan for preventive services as well as general preventive health recommendations were provided to patient.    Sue Lush, LPN   03/16/1094   After Visit Summary: (MyChart) Due to this being a telephonic visit, the after visit summary with patients personalized plan was offered to patient via MyChart   Nurse Notes: The patient states she is  doing well and has no concerns or questions at this time.

## 2023-04-14 NOTE — Patient Instructions (Signed)
Valerie Paul , Thank you for taking time to come for your Medicare Wellness Visit. I appreciate your ongoing commitment to your health goals. Please review the following plan we discussed and let me know if I can assist you in the future.   Referrals/Orders/Follow-Ups/Clinician Recommendations: none  This is a list of the screening recommended for you and due dates:  Health Maintenance  Topic Date Due   COVID-19 Vaccine (3 - Moderna risk series) 02/15/2020   Zoster (Shingles) Vaccine (2 of 2) 07/08/2022   DTaP/Tdap/Td vaccine (1 - Tdap) 06/30/2023*   Medicare Annual Wellness Visit  04/13/2024   Pneumonia Vaccine  Completed   Flu Shot  Completed   DEXA scan (bone density measurement)  Completed   Hepatitis C Screening  Completed   HPV Vaccine  Aged Out   Colon Cancer Screening  Discontinued  *Topic was postponed. The date shown is not the original due date.    Advanced directives: (Declined) Advance directive discussed with you today. Even though you declined this today, please call our office should you change your mind, and we can give you the proper paperwork for you to fill out.  Next Medicare Annual Wellness Visit scheduled for next year: Yes 04/14/24 @ 2:20pm televisit

## 2023-04-16 ENCOUNTER — Other Ambulatory Visit (INDEPENDENT_AMBULATORY_CARE_PROVIDER_SITE_OTHER): Payer: Medicare Other

## 2023-04-16 DIAGNOSIS — N1831 Chronic kidney disease, stage 3a: Secondary | ICD-10-CM

## 2023-04-16 DIAGNOSIS — E559 Vitamin D deficiency, unspecified: Secondary | ICD-10-CM

## 2023-04-16 DIAGNOSIS — E049 Nontoxic goiter, unspecified: Secondary | ICD-10-CM | POA: Diagnosis not present

## 2023-04-16 DIAGNOSIS — E785 Hyperlipidemia, unspecified: Secondary | ICD-10-CM

## 2023-04-16 LAB — CBC WITH DIFFERENTIAL/PLATELET
Basophils Absolute: 0.1 10*3/uL (ref 0.0–0.1)
Basophils Relative: 1.2 % (ref 0.0–3.0)
Eosinophils Absolute: 0.2 10*3/uL (ref 0.0–0.7)
Eosinophils Relative: 4.1 % (ref 0.0–5.0)
HCT: 43.1 % (ref 36.0–46.0)
Hemoglobin: 14.3 g/dL (ref 12.0–15.0)
Lymphocytes Relative: 43.8 % (ref 12.0–46.0)
Lymphs Abs: 2.2 10*3/uL (ref 0.7–4.0)
MCHC: 33.3 g/dL (ref 30.0–36.0)
MCV: 89.8 fL (ref 78.0–100.0)
Monocytes Absolute: 0.4 10*3/uL (ref 0.1–1.0)
Monocytes Relative: 7 % (ref 3.0–12.0)
Neutro Abs: 2.2 10*3/uL (ref 1.4–7.7)
Neutrophils Relative %: 43.9 % (ref 43.0–77.0)
Platelets: 396 10*3/uL (ref 150.0–400.0)
RBC: 4.8 Mil/uL (ref 3.87–5.11)
RDW: 13.4 % (ref 11.5–15.5)
WBC: 5.1 10*3/uL (ref 4.0–10.5)

## 2023-04-16 LAB — MICROALBUMIN / CREATININE URINE RATIO
Creatinine,U: 248.7 mg/dL
Microalb Creat Ratio: 0.6 mg/g (ref 0.0–30.0)
Microalb, Ur: 1.5 mg/dL (ref 0.0–1.9)

## 2023-04-16 LAB — COMPREHENSIVE METABOLIC PANEL
ALT: 20 U/L (ref 0–35)
AST: 21 U/L (ref 0–37)
Albumin: 4.5 g/dL (ref 3.5–5.2)
Alkaline Phosphatase: 82 U/L (ref 39–117)
BUN: 14 mg/dL (ref 6–23)
CO2: 31 meq/L (ref 19–32)
Calcium: 9.6 mg/dL (ref 8.4–10.5)
Chloride: 104 meq/L (ref 96–112)
Creatinine, Ser: 1.01 mg/dL (ref 0.40–1.20)
GFR: 53.75 mL/min — ABNORMAL LOW (ref >60.00)
Glucose, Bld: 96 mg/dL (ref 70–99)
Potassium: 4.3 meq/L (ref 3.5–5.1)
Sodium: 143 meq/L (ref 135–145)
Total Bilirubin: 0.7 mg/dL (ref 0.2–1.2)
Total Protein: 7.3 g/dL (ref 6.0–8.3)

## 2023-04-16 LAB — TSH: TSH: 2.99 u[IU]/mL (ref 0.35–5.50)

## 2023-04-16 LAB — LIPID PANEL
Cholesterol: 145 mg/dL (ref 0–200)
HDL: 53.5 mg/dL (ref >39.00)
LDL Cholesterol: 71 mg/dL (ref 0–99)
NonHDL: 91.41
Total CHOL/HDL Ratio: 3
Triglycerides: 104 mg/dL (ref 0.0–149.0)
VLDL: 20.8 mg/dL (ref 0.0–40.0)

## 2023-04-16 LAB — PHOSPHORUS: Phosphorus: 3.4 mg/dL (ref 2.3–4.6)

## 2023-04-16 LAB — VITAMIN D 25 HYDROXY (VIT D DEFICIENCY, FRACTURES): VITD: 21.01 ng/mL — ABNORMAL LOW (ref 30.00–100.00)

## 2023-04-17 LAB — PARATHYROID HORMONE, INTACT (NO CA): PTH: 10 pg/mL — ABNORMAL LOW (ref 16–77)

## 2023-04-23 ENCOUNTER — Ambulatory Visit: Payer: Medicare Other | Admitting: Family Medicine

## 2023-04-23 ENCOUNTER — Encounter: Payer: Self-pay | Admitting: Family Medicine

## 2023-04-23 VITALS — BP 136/76 | HR 89 | Temp 98.1°F | Ht 61.25 in | Wt 168.1 lb

## 2023-04-23 DIAGNOSIS — M85851 Other specified disorders of bone density and structure, right thigh: Secondary | ICD-10-CM

## 2023-04-23 DIAGNOSIS — E559 Vitamin D deficiency, unspecified: Secondary | ICD-10-CM | POA: Diagnosis not present

## 2023-04-23 DIAGNOSIS — Z7189 Other specified counseling: Secondary | ICD-10-CM

## 2023-04-23 DIAGNOSIS — E66811 Obesity, class 1: Secondary | ICD-10-CM

## 2023-04-23 DIAGNOSIS — E7800 Pure hypercholesterolemia, unspecified: Secondary | ICD-10-CM | POA: Diagnosis not present

## 2023-04-23 DIAGNOSIS — E049 Nontoxic goiter, unspecified: Secondary | ICD-10-CM

## 2023-04-23 DIAGNOSIS — N1831 Chronic kidney disease, stage 3a: Secondary | ICD-10-CM

## 2023-04-23 LAB — POC URINALSYSI DIPSTICK (AUTOMATED)
Bilirubin, UA: NEGATIVE
Blood, UA: NEGATIVE
Glucose, UA: NEGATIVE
Ketones, UA: NEGATIVE
Nitrite, UA: NEGATIVE
Protein, UA: NEGATIVE
Spec Grav, UA: 1.025 (ref 1.010–1.025)
Urobilinogen, UA: 0.2 U/dL
pH, UA: 6 (ref 5.0–8.0)

## 2023-04-23 MED ORDER — ASPIRIN 81 MG PO TBEC: 81.0000 mg | DELAYED_RELEASE_TABLET | ORAL | Status: AC

## 2023-04-23 NOTE — Assessment & Plan Note (Signed)
Chronic, well controlled on atorvastatin 20 - continue. The 10-year ASCVD risk score (Arnett DK, et al., 2019) is: 15.3%   Values used to calculate the score:     Age: 78 years     Sex: Female     Is Non-Hispanic African American: Yes     Diabetic: No     Tobacco smoker: No     Systolic Blood Pressure: 136 mmHg     Is BP treated: No     HDL Cholesterol: 53.5 mg/dL     Total Cholesterol: 145 mg/dL

## 2023-04-23 NOTE — Addendum Note (Signed)
Addended by: Nanci Pina on: 04/23/2023 03:40 PM   Modules accepted: Orders

## 2023-04-23 NOTE — Patient Instructions (Addendum)
Call to schedule mammogram at your convenience: [facility] in [place] [phone] Schedule eye exam at [facility].  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 Kidney prevention handout provided today. I will order kidney ultrasound to be done at [facility] - we will call you to schedule this. Urinalysis today.  Return in 1 year for next wellness visit and follow up visit

## 2023-04-23 NOTE — Progress Notes (Addendum)
Ph: [phone] Fax: [phone]   Patient ID: Valerie Paul, female    DOB: 01-08-1946, 78 y.o.   MRN: [id number]  This visit was conducted in person.  BP 136/76   Pulse 89   Temp 98.1 F (36.7 C) (Oral)   Ht 5' 1.25" (1.556 m)   Wt 168 lb 2 oz (76.3 kg)   SpO2 98%   BMI 31.51 kg/m    CC: AMW f/u visit  Subjective:   HPI: Valerie Paul is a 78 y.o. female presenting on 04/23/2023 for Annual Exam (MCR prt 2 [AWV- 04/14/23].)   Saw health advisor last week for medicare wellness visit. Note reviewed.   Last CPE 07/2021.   No results found.  Flowsheet Row Clinical Support from 04/14/2023 in [facility]  PHQ-2 Total Score 0          04/12/2023    8:46 AM 04/10/2022    3:10 PM 04/08/2022    8:57 AM 04/09/2021    2:05 PM 04/07/2020    2:01 PM  Fall Risk   Falls in the past year? 0 0 0 0 0  Number falls in past yr: 0 0 0 0 0  Injury with Fall? 0 0 0 0 0  Risk for fall due to : No Fall Risks No Fall Risks  No Fall Risks No Fall Risks  Follow up Education provided;Falls prevention discussed Falls prevention discussed;Falls evaluation completed  Falls prevention discussed Falls evaluation completed;Falls prevention discussed   CKD - reviewed. No h/o kidney stones or UTIs, no HTN or DM.  Fmhx kidney disease- 2 maternal aunts with kidney failure.   Preventative: COLONOSCOPY 02/2018 - 4 HP polyps, consider rpt 10 yrs ([place]) Well woman exam - normal pap smear 07/2015. Always normal pap smears. Normal pelvic exam 2020. Age out of cervical cancer screening. No pelvic pain/pressure, vaginal bleeding, skin changes.  Mammogram - Birads1 07/2021 @ [facility]. Breast exams at home. # provided to schedule.  DEXA 07/2021 - T -1.4 R femur neck, not at increased fracture risk. Rec good calcium in diet, vit D 1000 IU daily, regular walking.  Lung cancer screening - not eligible  Flu shot yearly COVID vaccine Moderna 03/2019, 04/2019, booster 01/2020 Pneumovax -  2014, prevnar-13 2015 Shingrix - to check with pharmacy  Advanced directives - packet provided previously, still has at home. Would want husband to be HCPOA. asked to bring [place] a copy Seat belt use discussed. Sunscreen use discussed. No changing moles on skin.  Ex smoker - 1/2 ppd for 40 yrs, quit >15 yrs ago. Husband restarted smoking.  Alcohol - rare  Dentist - saw 2024 Eye exam yearly - due Bowel - no constipation Bladder - no incontinence   G0P0  Lives with husband, no pets   Occupation: retired, was Wellsite geologist   Edu: BS business administration   Activity: exercise class 3x/wk (silver sneakers) - has stopped, walking, enjoys line dancing  Diet: good water, some fruits/vegetables       Relevant past medical, surgical, family and social history reviewed and updated as indicated. Interim medical history since our last visit reviewed. Allergies and medications reviewed and updated. Outpatient Medications Prior to Visit  Medication Sig Dispense Refill   atorvastatin (LIPITOR) 20 MG tablet TAKE 1 TABLET BY MOUTH EVERY DAY 90 tablet 0   Cholecalciferol (VITAMIN D3) 50 MCG (2000 UT) capsule Take 1 capsule (2,000 Units total) by mouth daily.     GARLIC  PO Take by mouth daily.     Multiple Vitamin (MULTIVITAMIN) tablet Take 1 tablet by mouth daily.     vitamin C (ASCORBIC ACID) 500 MG tablet Take 500 mg by mouth daily.     aspirin 81 MG tablet Take 1 tablet (81 mg total) by mouth 2 (two) times a week. (Patient taking differently: Take 81 mg by mouth once a week.) 30 tablet    No facility-administered medications prior to visit.     Per HPI unless specifically indicated in ROS section below Review of Systems  Objective:  BP 136/76   Pulse 89   Temp 98.1 F (36.7 C) (Oral)   Ht 5' 1.25" (1.556 m)   Wt 168 lb 2 oz (76.3 kg)   SpO2 98%   BMI 31.51 kg/m   Wt Readings from Last 3 Encounters:  04/23/23 168 lb 2 oz (76.3 kg)  04/14/23 168 lb (76.2 kg)  04/10/22 168 lb (76.2  kg)      Physical Exam Vitals and nursing note reviewed.  Constitutional:      Appearance: Normal appearance. She is not ill-appearing.  HENT:     Head: Normocephalic and atraumatic.     Right Ear: Tympanic membrane, ear canal and external ear normal. There is no impacted cerumen.     Left Ear: Tympanic membrane, ear canal and external ear normal. There is no impacted cerumen.     Mouth/Throat:     Mouth: Mucous membranes are moist.     Pharynx: Oropharynx is clear. No oropharyngeal exudate or posterior oropharyngeal erythema.  Eyes:     General:        Right eye: No discharge.        Left eye: No discharge.     Extraocular Movements: Extraocular movements intact.     Conjunctiva/sclera: Conjunctivae normal.     Pupils: Pupils are equal, round, and reactive to light.  Neck:     Thyroid: No thyroid mass or thyromegaly.  Cardiovascular:     Rate and Rhythm: Normal rate and regular rhythm.     Pulses: Normal pulses.     Heart sounds: Normal heart sounds. No murmur heard. Pulmonary:     Effort: Pulmonary effort is normal. No respiratory distress.     Breath sounds: Normal breath sounds. No wheezing, rhonchi or rales.  Abdominal:     General: Bowel sounds are normal. There is no distension.     Palpations: Abdomen is soft. There is no mass.     Tenderness: There is no abdominal tenderness. There is no guarding or rebound.     Hernia: No hernia is present.  Musculoskeletal:     Cervical back: Normal range of motion and neck supple. No rigidity.     Right lower leg: No edema.     Left lower leg: No edema.  Lymphadenopathy:     Cervical: No cervical adenopathy.  Skin:    General: Skin is warm and dry.     Findings: No rash.  Neurological:     General: No focal deficit present.     Mental Status: She is alert. Mental status is at baseline.  Psychiatric:        Mood and Affect: Mood normal.        Behavior: Behavior normal.       Results for orders placed or performed in  visit on 04/16/23  TSH   Collection Time: 04/16/23  8:55 AM  Result Value Ref Range   TSH 2.99 0.35 -  5.50 uIU/mL  CBC with Differential/Platelet   Collection Time: 04/16/23  8:55 AM  Result Value Ref Range   WBC 5.1 4.0 - 10.5 K/uL   RBC 4.80 3.87 - 5.11 Mil/uL   Hemoglobin 14.3 12.0 - 15.0 g/dL   HCT 16.1 09.6 - 04.5 %   MCV 89.8 78.0 - 100.0 fl   MCHC 33.3 30.0 - 36.0 g/dL   RDW 40.9 81.1 - 91.4 %   Platelets 396.0 150.0 - 400.0 K/uL   Neutrophils Relative % 43.9 43.0 - 77.0 %   Lymphocytes Relative 43.8 12.0 - 46.0 %   Monocytes Relative 7.0 3.0 - 12.0 %   Eosinophils Relative 4.1 0.0 - 5.0 %   Basophils Relative 1.2 0.0 - 3.0 %   Neutro Abs 2.2 1.4 - 7.7 K/uL   Lymphs Abs 2.2 0.7 - 4.0 K/uL   Monocytes Absolute 0.4 0.1 - 1.0 K/uL   Eosinophils Absolute 0.2 0.0 - 0.7 K/uL   Basophils Absolute 0.1 0.0 - 0.1 K/uL  Parathyroid hormone, intact (no Ca)   Collection Time: 04/16/23  8:55 AM  Result Value Ref Range   PTH 10 (L) 16 - 77 pg/mL  Microalbumin / creatinine urine ratio   Collection Time: 04/16/23  8:55 AM  Result Value Ref Range   Microalb, Ur 1.5 0.0 - 1.9 mg/dL   Creatinine,U 782.9 mg/dL   Microalb Creat Ratio 0.6 0.0 - 30.0 mg/g  VITAMIN D 25 Hydroxy (Vit-D Deficiency, Fractures)   Collection Time: 04/16/23  8:55 AM  Result Value Ref Range   VITD 21.01 (L) 30.00 - 100.00 ng/mL  Phosphorus   Collection Time: 04/16/23  8:55 AM  Result Value Ref Range   Phosphorus 3.4 2.3 - 4.6 mg/dL  Comprehensive metabolic panel   Collection Time: 04/16/23  8:55 AM  Result Value Ref Range   Sodium 143 135 - 145 mEq/L   Potassium 4.3 3.5 - 5.1 mEq/L   Chloride 104 96 - 112 mEq/L   CO2 31 19 - 32 mEq/L   Glucose, Bld 96 70 - 99 mg/dL   BUN 14 6 - 23 mg/dL   Creatinine, Ser 5.62 0.40 - 1.20 mg/dL   Total Bilirubin 0.7 0.2 - 1.2 mg/dL   Alkaline Phosphatase 82 39 - 117 U/L   AST 21 0 - 37 U/L   ALT 20 0 - 35 U/L   Total Protein 7.3 6.0 - 8.3 g/dL   Albumin 4.5 3.5 - 5.2  g/dL   GFR 13.08 (L) >65.78 mL/min   Calcium 9.6 8.4 - 10.5 mg/dL  Lipid panel   Collection Time: 04/16/23  8:55 AM  Result Value Ref Range   Cholesterol 145 0 - 200 mg/dL   Triglycerides 469.6 0.0 - 149.0 mg/dL   HDL 29.52 >84.13 mg/dL   VLDL 24.4 0.0 - 01.0 mg/dL   LDL Cholesterol 71 0 - 99 mg/dL   Total CHOL/HDL Ratio 3    NonHDL 91.41     Assessment & Plan:   Problem List Items Addressed This Visit     Advanced care planning/counseling discussion - Primary (Chronic)   Advanced directives - packet provided previously, still has at home. Would want husband to be HCPOA. asked to bring [place] a copy      HLD (hyperlipidemia)   Chronic, well controlled on atorvastatin 20 - continue. The 10-year ASCVD risk score (Arnett DK, et al., 2019) is: 15.3%   Values used to calculate the score:     Age: 30 years  Sex: Female     Is Non-Hispanic African American: Yes     Diabetic: No     Tobacco smoker: No     Systolic Blood Pressure: 136 mmHg     Is BP treated: No     HDL Cholesterol: 53.5 mg/dL     Total Cholesterol: 145 mg/dL       Relevant Medications   aspirin EC 81 MG tablet   Goiter   H/o benign thyroid biopsy in [place].  TSH remains normal.       Obesity, Class I, BMI 30-34.9   Continue to encourage healthy diet and lifestyle choices to affect sustainable weight loss.       Osteopenia   Continue vit D, regular weight bearing exercises, dietary calcium intake.       Vitamin D deficiency   She declines Rx weekly dose Increase vit D to 2000 units daily      CKD (chronic kidney disease) stage 3, GFR 30-59 ml/min (HCC)   Reviewed renal function over the past 2 years - GFR persistently 50s since 2022.  No h/o kidney stones, UTIs, DM, HTN.  Will update UA and check renal ultrasound today. Consider SPEP next labs.  CKD prevention handout provided today.         Meds ordered this encounter  Medications   aspirin EC 81 MG tablet    Sig: Take 1 tablet (81 mg  total) by mouth once a week. Swallow whole.    No orders of the defined types were placed in this encounter.   Patient Instructions  Call to schedule mammogram at your convenience: [facility] in [place] [phone] Schedule eye exam at [facility]  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 Kidney prevention handout provided today. I will order kidney ultrasound to be done at [facility] - we will call you to schedule this. Urinalysis today.  Return in 1 year for next wellness visit and follow up visit   Follow up plan: Return in about 1 year (around 04/22/2024) for medicare wellness visit, follow up visit.  Eustaquio Boyden, MD

## 2023-04-23 NOTE — Assessment & Plan Note (Addendum)
Reviewed renal function over the past 2 years - GFR persistently 50s since 2022.  No h/o kidney stones, UTIs, DM, HTN.  Will update UA and check renal ultrasound today. Consider SPEP next labs.  CKD prevention handout provided today.

## 2023-04-23 NOTE — Assessment & Plan Note (Signed)
H/o benign thyroid biopsy in [place].  TSH remains normal.

## 2023-04-23 NOTE — Assessment & Plan Note (Addendum)
She declines Rx weekly dose Increase vit D to 2000 units daily

## 2023-04-23 NOTE — Assessment & Plan Note (Signed)
Continue to encourage healthy diet and lifestyle choices to affect sustainable weight loss.

## 2023-04-23 NOTE — Assessment & Plan Note (Signed)
Continue vit D, regular weight bearing exercises, dietary calcium intake.

## 2023-04-23 NOTE — Assessment & Plan Note (Signed)
Advanced directives - packet provided previously, still has at home. Would want husband to be HCPOA. asked to bring [place] a copy

## 2023-05-18 ENCOUNTER — Other Ambulatory Visit: Payer: Self-pay | Admitting: Family Medicine

## 2023-05-18 DIAGNOSIS — E7800 Pure hypercholesterolemia, unspecified: Secondary | ICD-10-CM

## 2023-06-12 DIAGNOSIS — H2513 Age-related nuclear cataract, bilateral: Secondary | ICD-10-CM | POA: Diagnosis not present

## 2023-06-12 DIAGNOSIS — H40003 Preglaucoma, unspecified, bilateral: Secondary | ICD-10-CM | POA: Diagnosis not present

## 2023-06-12 DIAGNOSIS — H43813 Vitreous degeneration, bilateral: Secondary | ICD-10-CM | POA: Diagnosis not present

## 2023-06-12 DIAGNOSIS — H524 Presbyopia: Secondary | ICD-10-CM | POA: Diagnosis not present

## 2024-04-14 ENCOUNTER — Ambulatory Visit: Payer: Medicare Other

## 2024-06-03 ENCOUNTER — Ambulatory Visit
